# Patient Record
Sex: Female | Born: 1975 | Race: White | Hispanic: No | Marital: Married | Smoking: Current every day smoker
Health system: Southern US, Community
[De-identification: ages and names within clinical notes are randomized; demographics above are authoritative.]

## PROBLEM LIST (undated history)

## (undated) DIAGNOSIS — F32A Depression, unspecified: Secondary | ICD-10-CM

## (undated) DIAGNOSIS — R51 Headache: Secondary | ICD-10-CM

## (undated) DIAGNOSIS — T7840XA Allergy, unspecified, initial encounter: Secondary | ICD-10-CM

## (undated) DIAGNOSIS — E785 Hyperlipidemia, unspecified: Secondary | ICD-10-CM

## (undated) DIAGNOSIS — R519 Headache, unspecified: Secondary | ICD-10-CM

## (undated) DIAGNOSIS — F419 Anxiety disorder, unspecified: Secondary | ICD-10-CM

## (undated) DIAGNOSIS — R7301 Impaired fasting glucose: Secondary | ICD-10-CM

## (undated) DIAGNOSIS — F329 Major depressive disorder, single episode, unspecified: Secondary | ICD-10-CM

## (undated) DIAGNOSIS — F5104 Psychophysiologic insomnia: Secondary | ICD-10-CM

## (undated) DIAGNOSIS — K219 Gastro-esophageal reflux disease without esophagitis: Secondary | ICD-10-CM

## (undated) DIAGNOSIS — G5601 Carpal tunnel syndrome, right upper limb: Secondary | ICD-10-CM

## (undated) HISTORY — DX: Hyperlipidemia, unspecified: E78.5

## (undated) HISTORY — PX: EYE SURGERY: SHX253

## (undated) HISTORY — DX: Psychophysiologic insomnia: F51.04

## (undated) HISTORY — DX: Gastro-esophageal reflux disease without esophagitis: K21.9

## (undated) HISTORY — DX: Allergy, unspecified, initial encounter: T78.40XA

## (undated) HISTORY — DX: Impaired fasting glucose: R73.01

---

## 2001-06-16 ENCOUNTER — Encounter: Payer: Self-pay | Admitting: Family Medicine

## 2001-06-16 ENCOUNTER — Ambulatory Visit (HOSPITAL_COMMUNITY): Admission: RE | Admit: 2001-06-16 | Discharge: 2001-06-16 | Payer: Self-pay | Admitting: Advanced Practice Midwife

## 2001-11-10 ENCOUNTER — Other Ambulatory Visit: Admission: RE | Admit: 2001-11-10 | Discharge: 2001-11-10 | Payer: Self-pay | Admitting: Obstetrics and Gynecology

## 2002-02-22 ENCOUNTER — Other Ambulatory Visit: Admission: RE | Admit: 2002-02-22 | Discharge: 2002-02-22 | Payer: Self-pay | Admitting: Obstetrics and Gynecology

## 2002-04-12 ENCOUNTER — Ambulatory Visit (HOSPITAL_COMMUNITY): Admission: RE | Admit: 2002-04-12 | Discharge: 2002-04-12 | Payer: Self-pay | Admitting: Obstetrics and Gynecology

## 2002-04-12 ENCOUNTER — Encounter (INDEPENDENT_AMBULATORY_CARE_PROVIDER_SITE_OTHER): Payer: Self-pay | Admitting: Specialist

## 2002-04-27 ENCOUNTER — Emergency Department (HOSPITAL_COMMUNITY): Admission: EM | Admit: 2002-04-27 | Discharge: 2002-04-27 | Payer: Self-pay | Admitting: Internal Medicine

## 2002-04-27 ENCOUNTER — Encounter: Payer: Self-pay | Admitting: Internal Medicine

## 2005-05-24 ENCOUNTER — Encounter: Payer: Self-pay | Admitting: Orthopedic Surgery

## 2005-05-26 ENCOUNTER — Ambulatory Visit: Payer: Self-pay | Admitting: Orthopedic Surgery

## 2006-02-11 ENCOUNTER — Ambulatory Visit (HOSPITAL_COMMUNITY): Admission: RE | Admit: 2006-02-11 | Discharge: 2006-02-11 | Payer: Self-pay | Admitting: Family Medicine

## 2006-02-21 ENCOUNTER — Encounter (HOSPITAL_COMMUNITY): Admission: RE | Admit: 2006-02-21 | Discharge: 2006-03-23 | Payer: Self-pay | Admitting: Family Medicine

## 2006-03-23 ENCOUNTER — Ambulatory Visit: Payer: Self-pay | Admitting: Internal Medicine

## 2006-04-04 ENCOUNTER — Ambulatory Visit (HOSPITAL_COMMUNITY): Admission: RE | Admit: 2006-04-04 | Discharge: 2006-04-04 | Payer: Self-pay | Admitting: Internal Medicine

## 2006-04-04 ENCOUNTER — Encounter (INDEPENDENT_AMBULATORY_CARE_PROVIDER_SITE_OTHER): Payer: Self-pay | Admitting: Specialist

## 2006-04-04 ENCOUNTER — Ambulatory Visit: Payer: Self-pay | Admitting: Internal Medicine

## 2006-06-14 ENCOUNTER — Ambulatory Visit: Payer: Self-pay | Admitting: Internal Medicine

## 2008-12-06 ENCOUNTER — Encounter: Payer: Self-pay | Admitting: Orthopedic Surgery

## 2008-12-12 ENCOUNTER — Ambulatory Visit: Payer: Self-pay | Admitting: Orthopedic Surgery

## 2008-12-12 DIAGNOSIS — M25519 Pain in unspecified shoulder: Secondary | ICD-10-CM

## 2010-11-16 ENCOUNTER — Other Ambulatory Visit (HOSPITAL_COMMUNITY): Payer: Self-pay | Admitting: Family Medicine

## 2010-11-16 DIAGNOSIS — R51 Headache: Secondary | ICD-10-CM

## 2010-11-18 ENCOUNTER — Inpatient Hospital Stay (HOSPITAL_COMMUNITY): Admission: RE | Admit: 2010-11-18 | Payer: Self-pay | Source: Ambulatory Visit

## 2010-11-18 ENCOUNTER — Ambulatory Visit (HOSPITAL_COMMUNITY)
Admission: RE | Admit: 2010-11-18 | Discharge: 2010-11-18 | Disposition: A | Discharge status: Home / Self Care | Payer: Self-pay | Source: Ambulatory Visit | Attending: Family Medicine | Admitting: Family Medicine

## 2010-11-18 ENCOUNTER — Other Ambulatory Visit (HOSPITAL_COMMUNITY): Payer: Self-pay | Admitting: Family Medicine

## 2010-11-18 DIAGNOSIS — R51 Headache: Secondary | ICD-10-CM | POA: Insufficient documentation

## 2010-11-18 MED ORDER — GADOBENATE DIMEGLUMINE 529 MG/ML IV SOLN: 12.0000 mL | Freq: Once | INTRAVENOUS | Status: AC | PRN

## 2011-02-26 NOTE — Consult Note (Signed)
NAME:  Charlene Palmer, Charlene Palmer               ACCOUNT NO.:  [id number]   MEDICAL RECORD NO.:  [id number]          PATIENT TYPE:  OUT   LOCATION:  RAD                           FACILITY:  [facility]   PHYSICIAN:  R. Roetta Sessions, M.D. DATE OF BIRTH:  1975/10/22   DATE OF CONSULTATION:  03/23/2006  DATE OF DISCHARGE:  02/11/2006                                   CONSULTATION   REQUESTING PHYSICIAN:  Patrica Duel, M.D.   REASON FOR CONSULTATION:  Chronic diarrhea, hematochezia, abdominal pain.   HISTORY OF PRESENT ILLNESS:  Charlene Palmer is a 35 year old Caucasian female patient  of Dr. Patrica Duel who presents for further evaluation of a seven month  history of diarrhea with intermittent hematochezia.  Prior to these  symptoms, she would basically have one bowel movement daily.  No prior  history of constipation or diarrhea.  Now she is having 2-3 loose to watery  stools daily.  She has not had any solid stools in seven months.  She does  have diffuse intermittent abdominal pain, sometimes crampy in quality, maybe  in the upper abdomen or in the lower abdomen and changes.  She has had  several episodes of fresh blood per rectum, especially when on the toilet  tissue.  The last time was about three weeks ago.  She complains of fatigue,  occasional nausea.  No vomiting.  No heartburn symptoms, dysuria, hematuria,  sore throat, or chronic hoarseness.  She says Dr. Nobie Putnam recently examined  her mouth and told her she had some element of reflux.  She has had a sore  throat for about one week.  She was started on Bentyl three times a day, but  this constipated her.  She is taking it about once every three days.  Does  not help with her abdominal pain either.  She has not had any workup.  No  endoscopic evaluation.  She completed stool studies and Hemoccults, but she  is not aware of the results.   CURRENT MEDICATIONS:  1.  Butal/APAP 325 mg q.6h. p.r.n.  2.  Bentyl 10 mg q.i.d. p.r.n.  3.  Allegra 180 mg  daily p.r.n.   ALLERGIES:  TETRACYCLINE causes rash and vomiting.   PAST MEDICAL HISTORY:  1.  TMJ.  2.  History of headaches.  Self-treated with Advil in the month of Delphia,      when she reports consuming an entire bottle in a couple of weeks.  3.  Cesarean section.  She has had conization of her cervix for dysplasia      and a D&C.   FAMILY HISTORY:  Negative for chronic GI illnesses or colorectal cancer.   SOCIAL HISTORY:  She is married and has a 47-year-old daughter.  She is  employed as a Sales executive for [employer].  She smokes a pack of  cigarettes daily and smoked for over 12 years.  No alcohol use.   REVIEW OF SYSTEMS:  See HPI for GI.  CONSTITUTIONAL:  No weight loss.  CARDIOPULMONARY:  No chest pain or shortness of breath.  GENITOURINARY:  Currently completing her  menstrual cycle, which has been regular.  See HPI  for other GU symptoms.   PHYSICAL EXAMINATION:  VITAL SIGNS:  Weight 145.5.  Height 5 foot 6.  Temp  99.3, blood pressure 118/76.  GENERAL:  A pleasant, well-developed and well-nourished Caucasian female in  no acute distress.  SKIN:  Warm and dry.  No jaundice.  HEENT:  Pupils are equal, round and reactive to light.  Conjunctivae are  pink.  Sclerae are anicteric.  Oropharyngeal mucosa moist and pink.  No  lesions, erythema, or exudate.  NECK:  No lymphadenopathy or thyromegaly.  LUNGS:  Clear to auscultation.  HEART:  Regular rate and rhythm.  Normal S1 and S2.  No murmurs, rubs or  gallops.  ABDOMEN:  Positive bowel sounds.  Soft, nondistended.  She has mild  epigastric tenderness to deep palpation.  No rebound tenderness or guarding.  No abdominal bruits or hernias.  EXTREMITIES:  No edema.   IMPRESSION:  Charlene Palmer is a 35 year old lady with chronic diarrhea of seven  months' duration with intermittent toilet tissue hematochezia.  Symptoms are  associated with migratory abdominal pain, sometimes epigastric.  She has  consumed a significant  amount of Advil in the month for headache.  She  complains of intermittent nausea but no vomiting.  The etiology of chronic  diarrhea is unclear at this time.  Differential includes irritable bowel  syndrome with bleeding from benign rectal source such as hemorrhoids,  inflammatory bowel disease.  Cannot exclude celiac disease.  The patient  requests upper endoscopy, although at this time other than vague epigastric  pain, is fairly asymptomatic.   PLAN:  1.  Will plan on colonoscopy in the near future with Dr. Jena Gauss; however, if      this is negative, may consider upper endoscopy just in time to further      evaluate hematochezia and epigastric pain.  Could consider a small bowel      biopsy to rule out celiac disease if no other etiology of diarrhea has      been determined.  Not mentioned above, on an abdominal ultrasound      recently, she had a gallbladder polyp in Tuesday fundus of the      gallbladder versus nonmobile, nonshadowing gallstone.  She will need to      have a follow-up abdominal ultrasound in one year.  In addition, HIDA      scan was unremarkable.  2.  Retrieve stool studies and Hemoccults done through Dr. Loraine Leriche Cresenzo's      office.      Tana Coast, P.AJonathon Bellows, M.D.  Electronically Signed    LL/MEDQ  D:  03/23/2006  T:  03/23/2006  Job:  [id number]   cc:   Patrica Duel, M.D.  Fax: [phone]

## 2011-02-26 NOTE — Op Note (Signed)
NAME:  Charlene Palmer, Charlene Palmer               ACCOUNT NO.:  [id number]   MEDICAL RECORD NO.:  [id number]          PATIENT TYPE:  AMB   LOCATION:  DAY                           FACILITY:  [facility]   PHYSICIAN:  R. Roetta Sessions, M.D. DATE OF BIRTH:  30-Mar-1976   DATE OF PROCEDURE:  04/04/2006  DATE OF DISCHARGE:                                 OPERATIVE REPORT   PROCEDURE:  Colonoscopy with ileoscopy followed by EGD with small bowel  biopsy.   INDICATIONS FOR PROCEDURE:  The patient is a 35 year old female with a  several-month history of intermittent toilet tissue hematochezia.  She has  had some migratory abdominal pain and diarrhea in the setting of Advil use.  EGD and colonoscopy are now being done.  The procedure  was discussed with  the patient at length.  The potential risks, benefits, and alternatives have  been reviewed, questions answered.  Please see documentation in medical  records procedure note.  O2 saturation, blood pressure, pulse, and  respirations were monitored throughout the entire procedure.  Conscious  sedation with Versed 7 mg intravenously and Demerol 150 mg intravenously in  divided doses.   INSTRUMENTATION:  Olympus videoscope.   ASSESSMENT AND FINDINGS:  Digital rectal exam revealed no abnormalities.  Prep was adequate.  Rectum:  Examination of the rectal mucosa with the  retroflexed view of the anal verge demonstrated only minimal internal  hemorrhoids.  Colon:  The colonic mucosa was surveyed from the rectosigmoid  junction to the left transverse and right colon to the appendiceal orifice,  ileocecal valve, and cecum.  The structures were well-seen and photographed  for the record.  The terminal ileum was intubated a good 20 cm from the  __________.  Mucosal surfaces were again seen.  The colonic mucosa appeared  normal as did the terminal ileum mucosa.  Residue was suctioned out.  The  patient tolerated the procedure well and was then prepared for EGD.  Examination  of the esophagus revealed no mucosal abnormalities.  The EJ  junction was easily traversed into the stomach.  The gastric cavity was  emptied.  Thorough examination of the gastric mucosa, retroflexion in the  proximal stomach, esophagogastric junction demonstrated only a small hiatal  hernia, pylorus was patent and easily traversed.  Examination of the bulb to  the second and third portions revealed no abnormalities.   THERAPIES/DIAGNOSTIC MANEUVERS PERFORMED:  Biopsies of the second and third  portions of the duodenum were taken to rule out __________.  The patient  tolerated the procedure well.   IMPRESSION:  1.  Minimal anal canal internal hemorrhoids.  Otherwise normal rectum,      normal colon, normal terminal ileum, stool sample obtained.  2.  Esophagogastroduodenoscopy findings:  Normal esophagus, small hiatal      hernia, otherwise normal stomach, normal D1/D2/D3, status post biopsies      D2/D3.   RECOMMENDATIONS:  1.  Would avoid all forms of nonsteroidals at this time from here on out.  2.  Follow up on stool studies, follow up on biopsies, hemorrhoid literature      provided  to [name].  A 10-day course of Anusol-AC suppositories,      one per rectum at bedtime.  Again further recommendations to follow.      Jonathon Bellows, M.D.  Electronically Signed     RMR/MEDQ  D:  04/04/2006  T:  04/04/2006  Job:  [id number]   cc:   [facility]   R. Roetta Sessions, M.D.  [address]  [place]  [place] [address]

## 2011-02-26 NOTE — Op Note (Signed)
[facility]  Patient:    Charlene Palmer, Charlene Palmer Visit Number: [id number] MRN: [id number]          Service Type: DSU Location: DAY Attending Physician:  Malon Kindle Dictated by:   Malachi Pro. Ambrose Mantle, M.D. Proc. 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  OPERATION PERFORMED:   D&C laser cone.  SURGEON:  Malachi Pro. Ambrose Mantle, M.D.  ANESTHESIA:  General.  DESCRIPTION OF PROCEDURE:  The patient was brought to the operating room and placed under satisfactory general anesthesia and placed in lithotomy position. The vulva was prepped with Betadine solution. I did not prep the vagina to try to see if the acetic acid would give its best contrast of white epithelium to the normal pink epithelium. The cervix was exposed after draping the vulva with wet towels and acetic acid 5% was painted onto the cervix and then a colposcopy was done showing a large area of white epithelium with vascular changes involving both the anterior and posterior lips and the right lateral lip of the cervix. There were not any significant changes on the left. The squamocolumnar junction was far out on the patients cervix so that as I had explained to her in the office, we would do more of a cap like conization than a deep cone. If we did a true deep cone, we would take out most of her cervix. So I outlined the laser cone with a continuous laser with just spots with 10 watts and then injected the area with a dilute solution of Pitressin using about 10-12 cc of a mixture of 10 units of Pitressin and 120 cc of saline, so a total amount of 1 unit of Pitressin was used. I then switched to the super pulse laser at maximum  wattage and did the laser cone outline. Then using the double pronged hook I was able to elevate the cervical tissue so that I could angle in sharply so that we would not go too deeply into the cervical tissue. After I removed the cone with the scissors at the endocervical margin, I measured the cone and it seemed to be about 15 mm from the external cervix to the internal part of the endocervix. I then took a little bit more of the endocervical tissue with the scissors at about 3 oclock. At this point, I obtained complete hemostasis with a deep focus beam with 10 watts and then I did an endocervical followed by an endometrial curettage without any dilatation of the cervix, the uterus sounded about 3 inches posteriorly. I preserved the endocervical and endometrial tissue separately so that I had three specimens, the cone, the endocervical curettage and the endometrial curettage. I then reconstructed the architecture of the cervix by using four interrupted sutures of #0 Vicryl entering at 2 oclock exiting at 10, entering at 4 oclock exiting at 8 and then of course entering at 2 oclock exiting at 4 and at 8 oclock exiting at 10. I then placed a little bit of Munsell solution in the cervical canal and the procedure was terminated. Blood loss was thought to be less than 30 cc. The patient was  returned to recovery in satisfactory condition. Dictated by:   Malachi Pro. Ambrose Mantle, M.D. Attending Physician:  Malon Kindle DD:  04/12/02 TD:  04/16/02 Job: [id number]

## 2013-09-05 ENCOUNTER — Ambulatory Visit (INDEPENDENT_AMBULATORY_CARE_PROVIDER_SITE_OTHER): Payer: BC Managed Care – PPO | Admitting: Podiatry

## 2013-09-05 ENCOUNTER — Ambulatory Visit (INDEPENDENT_AMBULATORY_CARE_PROVIDER_SITE_OTHER): Payer: BC Managed Care – PPO

## 2013-09-05 ENCOUNTER — Encounter: Payer: Self-pay | Admitting: Podiatry

## 2013-09-05 VITALS — BP 148/79 | HR 90 | Resp 16 | Ht 66.0 in | Wt 170.0 lb

## 2013-09-05 DIAGNOSIS — M722 Plantar fascial fibromatosis: Secondary | ICD-10-CM

## 2013-09-05 MED ORDER — TRIAMCINOLONE ACETONIDE 10 MG/ML IJ SUSP
10.0000 mg | Freq: Once | INTRAMUSCULAR | Status: AC
  Administered 2013-09-05: 10 mg

## 2013-09-05 MED ORDER — MELOXICAM 15 MG PO TABS: 15.0000 mg | ORAL_TABLET | Freq: Every day | ORAL | Status: DC

## 2013-09-05 NOTE — Patient Instructions (Signed)
Plantar Fasciitis (Heel Spur Syndrome)  with Rehab  The plantar fascia is a fibrous, ligament-like, soft-tissue structure that spans the bottom of the foot. Plantar fasciitis is a condition that causes pain in the foot due to inflammation of the tissue.  SYMPTOMS   · Pain and tenderness on the underneath side of the foot.  · Pain that worsens with standing or walking.  CAUSES   Plantar fasciitis is caused by irritation and injury to the plantar fascia on the underneath side of the foot. Common mechanisms of injury include:  · Direct trauma to bottom of the foot.  · Damage to a small nerve that runs under the foot where the main fascia attaches to the heel bone.  · Stress placed on the plantar fascia due to bone spurs.  RISK INCREASES WITH:   · Activities that place stress on the plantar fascia (running, jumping, pivoting, or cutting).  · Poor strength and flexibility.  · Improperly fitted shoes.  · Tight calf muscles.  · Flat feet.  · Failure to warm-up properly before activity.  · Obesity.  PREVENTION  · Warm up and stretch properly before activity.  · Allow for adequate recovery between workouts.  · Maintain physical fitness:  · Strength, flexibility, and endurance.  · Cardiovascular fitness.  · Maintain a health body weight.  · Avoid stress on the plantar fascia.  · Wear properly fitted shoes, including arch supports for individuals who have flat feet.  PROGNOSIS   If treated properly, then the symptoms of plantar fasciitis usually resolve without surgery. However, occasionally surgery is necessary.  RELATED COMPLICATIONS   · Recurrent symptoms that may result in a chronic condition.  · Problems of the lower back that are caused by compensating for the injury, such as limping.  · Pain or weakness of the foot during push-off following surgery.  · Chronic inflammation, scarring, and partial or complete fascia tear, occurring more often from repeated injections.  TREATMENT   Treatment initially involves the use of  ice and medication to help reduce pain and inflammation. The use of strengthening and stretching exercises may help reduce pain with activity, especially stretches of the Achilles tendon. These exercises may be performed at home or with a therapist. Your caregiver may recommend that you use heel cups of arch supports to help reduce stress on the plantar fascia. Occasionally, corticosteroid injections are given to reduce inflammation. If symptoms persist for greater than 6 months despite non-surgical (conservative), then surgery may be recommended.   MEDICATION   · If pain medication is necessary, then nonsteroidal anti-inflammatory medications, such as aspirin and ibuprofen, or other minor pain relievers, such as acetaminophen, are often recommended.  · Do not take pain medication within 7 days before surgery.  · Prescription pain relievers may be given if deemed necessary by your caregiver. Use only as directed and only as much as you need.  · Corticosteroid injections may be given by your caregiver. These injections should be reserved for the most serious cases, because they may only be given a certain number of times.  HEAT AND COLD  · Cold treatment (icing) relieves pain and reduces inflammation. Cold treatment should be applied for 10 to 15 minutes every 2 to 3 hours for inflammation and pain and immediately after any activity that aggravates your symptoms. Use ice packs or massage the area with a piece of ice (ice massage).  · Heat treatment may be used prior to performing the stretching and strengthening activities prescribed   by your caregiver, physical therapist, or athletic trainer. Use a heat pack or soak the injury in warm water.  SEEK IMMEDIATE MEDICAL CARE IF:  · Treatment seems to offer no benefit, or the condition worsens.  · Any medications produce adverse side effects.  EXERCISES  RANGE OF MOTION (ROM) AND STRETCHING EXERCISES - Plantar Fasciitis (Heel Spur Syndrome)  These exercises may help you  when beginning to rehabilitate your injury. Your symptoms may resolve with or without further involvement from your physician, physical therapist or athletic trainer. While completing these exercises, remember:   · Restoring tissue flexibility helps normal motion to return to the joints. This allows healthier, less painful movement and activity.  · An effective stretch should be held for at least 30 seconds.  · A stretch should never be painful. You should only feel a gentle lengthening or release in the stretched tissue.  RANGE OF MOTION - Toe Extension, Flexion  · Sit with your right / left leg crossed over your opposite knee.  · Grasp your toes and gently pull them back toward the top of your foot. You should feel a stretch on the bottom of your toes and/or foot.  · Hold this stretch for __________ seconds.  · Now, gently pull your toes toward the bottom of your foot. You should feel a stretch on the top of your toes and or foot.  · Hold this stretch for __________ seconds.  Repeat __________ times. Complete this stretch __________ times per day.   RANGE OF MOTION - Ankle Dorsiflexion, Active Assisted  · Remove shoes and sit on a chair that is preferably not on a carpeted surface.  · Place right / left foot under knee. Extend your opposite leg for support.  · Keeping your heel down, slide your right / left foot back toward the chair until you feel a stretch at your ankle or calf. If you do not feel a stretch, slide your bottom forward to the edge of the chair, while still keeping your heel down.  · Hold this stretch for __________ seconds.  Repeat __________ times. Complete this stretch __________ times per day.   STRETCH  Gastroc, Standing  · Place hands on wall.  · Extend right / left leg, keeping the front knee somewhat bent.  · Slightly point your toes inward on your back foot.  · Keeping your right / left heel on the floor and your knee straight, shift your weight toward the wall, not allowing your back to  arch.  · You should feel a gentle stretch in the right / left calf. Hold this position for __________ seconds.  Repeat __________ times. Complete this stretch __________ times per day.  STRETCH  Soleus, Standing  · Place hands on wall.  · Extend right / left leg, keeping the other knee somewhat bent.  · Slightly point your toes inward on your back foot.  · Keep your right / left heel on the floor, bend your back knee, and slightly shift your weight over the back leg so that you feel a gentle stretch deep in your back calf.  · Hold this position for __________ seconds.  Repeat __________ times. Complete this stretch __________ times per day.  STRETCH  Gastrocsoleus, Standing   Note: This exercise can place a lot of stress on your foot and ankle. Please complete this exercise only if specifically instructed by your caregiver.   · Place the ball of your right / left foot on a step, keeping   your other foot firmly on the same step.  · Hold on to the wall or a rail for balance.  · Slowly lift your other foot, allowing your body weight to press your heel down over the edge of the step.  · You should feel a stretch in your right / left calf.  · Hold this position for __________ seconds.  · Repeat this exercise with a slight bend in your right / left knee.  Repeat __________ times. Complete this stretch __________ times per day.   STRENGTHENING EXERCISES - Plantar Fasciitis (Heel Spur Syndrome)   These exercises may help you when beginning to rehabilitate your injury. They may resolve your symptoms with or without further involvement from your physician, physical therapist or athletic trainer. While completing these exercises, remember:   · Muscles can gain both the endurance and the strength needed for everyday activities through controlled exercises.  · Complete these exercises as instructed by your physician, physical therapist or athletic trainer. Progress the resistance and repetitions only as guided.  STRENGTH - Towel  Curls  · Sit in a chair positioned on a non-carpeted surface.  · Place your foot on a towel, keeping your heel on the floor.  · Pull the towel toward your heel by only curling your toes. Keep your heel on the floor.  · If instructed by your physician, physical therapist or athletic trainer, add ____________________ at the end of the towel.  Repeat __________ times. Complete this exercise __________ times per day.  STRENGTH - Ankle Inversion  · Secure one end of a rubber exercise band/tubing to a fixed object (table, pole). Loop the other end around your foot just before your toes.  · Place your fists between your knees. This will focus your strengthening at your ankle.  · Slowly, pull your big toe up and in, making sure the band/tubing is positioned to resist the entire motion.  · Hold this position for __________ seconds.  · Have your muscles resist the band/tubing as it slowly pulls your foot back to the starting position.  Repeat __________ times. Complete this exercises __________ times per day.   Document Released: 09/27/2005 Document Revised: 12/20/2011 Document Reviewed: 01/09/2009  [employer]® Patient Information ©2014 [employer].

## 2013-09-05 NOTE — Progress Notes (Signed)
   Subjective:    Patient ID: Charlene Palmer, female    DOB: 27-Aug-1976, 37 y.o.   MRN: [id number]  HPI Comments: "My heel hurts"  N - sharp L - plantar heel right D - 2 mos O - gradual C - AM pain, worse A - walking T - changed shoes, advil, ice, Dr. Margart Sickles gel inserts      Review of Systems  HENT: Positive for sinus pressure.   Cardiovascular: Positive for leg swelling.  Musculoskeletal: Positive for arthralgias.  Neurological: Positive for numbness and headaches.  All other systems reviewed and are negative.       Objective:   Physical Exam        Assessment & Plan:

## 2013-09-07 NOTE — Progress Notes (Signed)
Subjective:     Patient ID: Charlene Palmer, female   DOB: 1976/03/24, 37 y.o.   MRN: [id number]  HPI patient presents stating I have been getting intense pain in my right heel for the last several months. She has tried shoe modifications ice therapy and over-the-counter insoles pain is worse when she gets up in the morning or after periods of sitting   Review of Systems  All other systems reviewed and are negative.       Objective:   Physical Exam  Constitutional: She is oriented to person, place, and time.  Cardiovascular: Intact distal pulses.   Musculoskeletal: Normal range of motion.  Neurological: She is oriented to person, place, and time.  Skin: Skin is warm.   patient is found to have pain evident tense nature right plantar heel insertional point of the tendon into the calcaneus. Neurovascular status intact with muscle strength adequate     Assessment:     Plantar fasciitis of an acute nature right with some structural abnormalities indicating chronic type condition    Plan:     H&P and x-rays reviewed. Injected the right plantar fascia 3 mg Kenalog 5 mg Xylocaine Marcaine mixture and applied fascially brace with instructions. Placed on oral anti-inflammatory and reappoint in 1 week and discuss possibilities for orthotics

## 2013-09-13 ENCOUNTER — Encounter: Payer: Self-pay | Admitting: Podiatry

## 2013-09-13 ENCOUNTER — Ambulatory Visit (INDEPENDENT_AMBULATORY_CARE_PROVIDER_SITE_OTHER): Payer: BC Managed Care – PPO | Admitting: Podiatry

## 2013-09-13 VITALS — BP 138/78 | HR 75 | Resp 16

## 2013-09-13 DIAGNOSIS — M722 Plantar fascial fibromatosis: Secondary | ICD-10-CM

## 2013-09-13 MED ORDER — TRIAMCINOLONE ACETONIDE 10 MG/ML IJ SUSP
10.0000 mg | Freq: Once | INTRAMUSCULAR | Status: AC
  Administered 2013-09-13: 10 mg

## 2013-09-13 NOTE — Progress Notes (Signed)
Subjective:     Patient ID: Charlene Palmer, female   DOB: Jan 25, 1976, 38 y.o.   MRN: [id number]  HPI patient states that my heel is still real sore and I got about 24 hours of relief followed by reoccurrence of symptoms  Review of Systems     Objective:   Physical Exam Neurovascular status intact with no change in health history. Patient has intense discomfort medial fascially and right at the insertional point but less than it was last week    Assessment:     Plantar fasciitis right heel still present with discomfort upon palpation    Plan:     Reviewed condition and discussed physical therapy and reinjected the plantar fascia 3 mg Kenalog 5 mg Xylocaine Marcaine mixture and dispensed night splint with instructions on usage. Continue anti-inflammatory and scanned for custom orthotic devices to reduce stress on the heels

## 2013-11-02 ENCOUNTER — Other Ambulatory Visit: Payer: BC Managed Care – PPO

## 2014-03-21 ENCOUNTER — Encounter: Payer: Self-pay | Admitting: Podiatry

## 2014-03-21 ENCOUNTER — Ambulatory Visit (INDEPENDENT_AMBULATORY_CARE_PROVIDER_SITE_OTHER): Payer: BC Managed Care – PPO | Admitting: Podiatry

## 2014-03-21 VITALS — BP 124/69 | HR 96 | Resp 16

## 2014-03-21 DIAGNOSIS — M722 Plantar fascial fibromatosis: Secondary | ICD-10-CM

## 2014-03-21 MED ORDER — TRIAMCINOLONE ACETONIDE 10 MG/ML IJ SUSP
10.0000 mg | Freq: Once | INTRAMUSCULAR | Status: AC
  Administered 2014-03-21: 10 mg

## 2014-03-21 NOTE — Patient Instructions (Signed)

## 2014-03-25 NOTE — Progress Notes (Signed)
Subjective:     Patient ID: Charlene Palmer, female   DOB: [birth year], 38 y.o.   MRN: [id number]  HPI patient states that she is improving but she is having pain in the heel right of a mild/moderate nature. States the orthotics help a lot the pain is occurring   Review of Systems     Objective:   Physical Exam Neurovascular status found to be intact with no changes in health history and pain to palpation of the plantar heel right is noted    Assessment:     Mild recurrence of fascially pain right    Plan:     Injected the plantar fascia 3 mg Kenalog Xylocaine and advised him reduced activity and continued orthotic usage but building them up slowly. Reappoint her recheck for consideration of lowering of the orthotics if pain persists

## 2014-10-17 ENCOUNTER — Ambulatory Visit: Payer: BC Managed Care – PPO | Admitting: Podiatry

## 2014-10-24 ENCOUNTER — Ambulatory Visit (INDEPENDENT_AMBULATORY_CARE_PROVIDER_SITE_OTHER): Payer: BLUE CROSS/BLUE SHIELD | Admitting: Podiatry

## 2014-10-24 ENCOUNTER — Encounter: Payer: Self-pay | Admitting: Podiatry

## 2014-10-24 VITALS — BP 142/79 | HR 93 | Resp 16

## 2014-10-24 DIAGNOSIS — M722 Plantar fascial fibromatosis: Secondary | ICD-10-CM | POA: Diagnosis not present

## 2014-10-24 MED ORDER — TRIAMCINOLONE ACETONIDE 10 MG/ML IJ SUSP
10.0000 mg | Freq: Once | INTRAMUSCULAR | Status: AC
  Administered 2014-10-24: 10 mg

## 2014-10-24 NOTE — Progress Notes (Signed)
Subjective:     Patient ID: Charlene Palmer, female   DOB: 1975/11/07, 39 y.o.   MRN: [id number]  HPI patient states my right foot is really bother me again and it started about 4 weeks ago and I do not remember specific incident   Review of Systems     Objective:   Physical Exam Neurovascular status intact with no change in health history and quite a bit of discomfort in the plantar right heel at the insertion of the tendon into the calcaneus    Assessment:     Plantar fasciitis right with inflammation    Plan:     Injected the right plantar fascia 3 mg Kenalog 5 mg Xylocaine advised on physical therapy supportive shoe and be seen as needed

## 2015-02-03 ENCOUNTER — Other Ambulatory Visit: Payer: Self-pay | Admitting: Orthopedic Surgery

## 2015-04-22 ENCOUNTER — Encounter (HOSPITAL_BASED_OUTPATIENT_CLINIC_OR_DEPARTMENT_OTHER): Payer: Self-pay | Admitting: *Deleted

## 2015-04-24 ENCOUNTER — Encounter (HOSPITAL_BASED_OUTPATIENT_CLINIC_OR_DEPARTMENT_OTHER)
Admission: RE | Disposition: A | Discharge status: Home / Self Care | Payer: Self-pay | Source: Ambulatory Visit | Attending: Orthopedic Surgery

## 2015-04-24 ENCOUNTER — Encounter (HOSPITAL_BASED_OUTPATIENT_CLINIC_OR_DEPARTMENT_OTHER): Payer: Self-pay | Admitting: Orthopedic Surgery

## 2015-04-24 ENCOUNTER — Ambulatory Visit (HOSPITAL_BASED_OUTPATIENT_CLINIC_OR_DEPARTMENT_OTHER): Payer: BLUE CROSS/BLUE SHIELD | Admitting: Anesthesiology

## 2015-04-24 ENCOUNTER — Ambulatory Visit (HOSPITAL_BASED_OUTPATIENT_CLINIC_OR_DEPARTMENT_OTHER)
Admission: RE | Admit: 2015-04-24 | Discharge: 2015-04-24 | Disposition: A | Discharge status: Home / Self Care | Payer: BLUE CROSS/BLUE SHIELD | Source: Ambulatory Visit | Attending: Orthopedic Surgery | Admitting: Orthopedic Surgery

## 2015-04-24 DIAGNOSIS — F17210 Nicotine dependence, cigarettes, uncomplicated: Secondary | ICD-10-CM | POA: Insufficient documentation

## 2015-04-24 DIAGNOSIS — F329 Major depressive disorder, single episode, unspecified: Secondary | ICD-10-CM | POA: Insufficient documentation

## 2015-04-24 DIAGNOSIS — G5622 Lesion of ulnar nerve, left upper limb: Secondary | ICD-10-CM | POA: Insufficient documentation

## 2015-04-24 DIAGNOSIS — F419 Anxiety disorder, unspecified: Secondary | ICD-10-CM | POA: Diagnosis not present

## 2015-04-24 DIAGNOSIS — Z793 Long term (current) use of hormonal contraceptives: Secondary | ICD-10-CM | POA: Insufficient documentation

## 2015-04-24 DIAGNOSIS — K219 Gastro-esophageal reflux disease without esophagitis: Secondary | ICD-10-CM | POA: Diagnosis not present

## 2015-04-24 DIAGNOSIS — G5602 Carpal tunnel syndrome, left upper limb: Secondary | ICD-10-CM | POA: Diagnosis not present

## 2015-04-24 DIAGNOSIS — Z79899 Other long term (current) drug therapy: Secondary | ICD-10-CM | POA: Insufficient documentation

## 2015-04-24 DIAGNOSIS — G5601 Carpal tunnel syndrome, right upper limb: Secondary | ICD-10-CM | POA: Diagnosis not present

## 2015-04-24 HISTORY — DX: Depression, unspecified: F32.A

## 2015-04-24 HISTORY — PX: CARPAL TUNNEL RELEASE: SHX101

## 2015-04-24 HISTORY — DX: Headache, unspecified: R51.9

## 2015-04-24 HISTORY — DX: Major depressive disorder, single episode, unspecified: F32.9

## 2015-04-24 HISTORY — DX: Headache: R51

## 2015-04-24 HISTORY — DX: Carpal tunnel syndrome, right upper limb: G56.01

## 2015-04-24 HISTORY — DX: Anxiety disorder, unspecified: F41.9

## 2015-04-24 SURGERY — CARPAL TUNNEL RELEASE
Anesthesia: Regional | Site: Wrist | Laterality: Right

## 2015-04-24 MED ORDER — PROPOFOL 500 MG/50ML IV EMUL
INTRAVENOUS | Status: AC
  Filled 2015-04-24: qty 50

## 2015-04-24 MED ORDER — SCOPOLAMINE 1 MG/3DAYS TD PT72: 1.0000 | MEDICATED_PATCH | Freq: Once | TRANSDERMAL | Status: DC | PRN

## 2015-04-24 MED ORDER — ONDANSETRON HCL 4 MG/2ML IJ SOLN: 4.0000 mg | Freq: Once | INTRAMUSCULAR | Status: DC | PRN

## 2015-04-24 MED ORDER — MEPERIDINE HCL 25 MG/ML IJ SOLN: 6.2500 mg | INTRAMUSCULAR | Status: DC | PRN

## 2015-04-24 MED ORDER — PROPOFOL 10 MG/ML IV BOLUS
INTRAVENOUS | Status: DC | PRN
  Administered 2015-04-24: 50 mg via INTRAVENOUS
  Administered 2015-04-24: 150 mg via INTRAVENOUS

## 2015-04-24 MED ORDER — MIDAZOLAM HCL 2 MG/2ML IJ SOLN
INTRAMUSCULAR | Status: AC
  Filled 2015-04-24: qty 2

## 2015-04-24 MED ORDER — LACTATED RINGERS IV SOLN
INTRAVENOUS | Status: DC
  Administered 2015-04-24: 08:00:00 via INTRAVENOUS

## 2015-04-24 MED ORDER — BUPIVACAINE HCL (PF) 0.25 % IJ SOLN
INTRAMUSCULAR | Status: AC
  Filled 2015-04-24: qty 180

## 2015-04-24 MED ORDER — OXYCODONE HCL 5 MG PO TABS
5.0000 mg | ORAL_TABLET | Freq: Once | ORAL | Status: AC
  Administered 2015-04-24: 5 mg via ORAL

## 2015-04-24 MED ORDER — TRAMADOL HCL 50 MG PO TABS: 50.0000 mg | ORAL_TABLET | Freq: Four times a day (QID) | ORAL | Status: DC | PRN

## 2015-04-24 MED ORDER — VANCOMYCIN HCL IN DEXTROSE 1-5 GM/200ML-% IV SOLN
1000.0000 mg | INTRAVENOUS | Status: AC
  Administered 2015-04-24: 1000 mg via INTRAVENOUS

## 2015-04-24 MED ORDER — FENTANYL CITRATE (PF) 100 MCG/2ML IJ SOLN
50.0000 ug | INTRAMUSCULAR | Status: DC | PRN
  Administered 2015-04-24: 100 ug via INTRAVENOUS

## 2015-04-24 MED ORDER — CHLORHEXIDINE GLUCONATE 4 % EX LIQD: 60.0000 mL | Freq: Once | CUTANEOUS | Status: DC

## 2015-04-24 MED ORDER — OXYCODONE HCL 5 MG PO TABS
ORAL_TABLET | ORAL | Status: AC
  Filled 2015-04-24: qty 1

## 2015-04-24 MED ORDER — MIDAZOLAM HCL 2 MG/2ML IJ SOLN
1.0000 mg | INTRAMUSCULAR | Status: DC | PRN
  Administered 2015-04-24: 2 mg via INTRAVENOUS

## 2015-04-24 MED ORDER — ONDANSETRON HCL 4 MG/2ML IJ SOLN
INTRAMUSCULAR | Status: DC | PRN
  Administered 2015-04-24: 4 mg via INTRAVENOUS

## 2015-04-24 MED ORDER — GLYCOPYRROLATE 0.2 MG/ML IJ SOLN: 0.2000 mg | Freq: Once | INTRAMUSCULAR | Status: DC | PRN

## 2015-04-24 MED ORDER — BUPIVACAINE HCL (PF) 0.25 % IJ SOLN
INTRAMUSCULAR | Status: DC | PRN
  Administered 2015-04-24: 8 mL

## 2015-04-24 MED ORDER — FENTANYL CITRATE (PF) 100 MCG/2ML IJ SOLN
INTRAMUSCULAR | Status: AC
  Filled 2015-04-24: qty 4

## 2015-04-24 MED ORDER — HYDROMORPHONE HCL 1 MG/ML IJ SOLN: 0.2500 mg | INTRAMUSCULAR | Status: DC | PRN

## 2015-04-24 MED ORDER — LIDOCAINE HCL (CARDIAC) 20 MG/ML IV SOLN
INTRAVENOUS | Status: DC | PRN
  Administered 2015-04-24: 50 mg via INTRAVENOUS

## 2015-04-24 MED ORDER — DEXAMETHASONE SODIUM PHOSPHATE 10 MG/ML IJ SOLN
INTRAMUSCULAR | Status: DC | PRN
  Administered 2015-04-24: 10 mg via INTRAVENOUS

## 2015-04-24 MED ORDER — VANCOMYCIN HCL IN DEXTROSE 1-5 GM/200ML-% IV SOLN: 1000.0000 mg | INTRAVENOUS | Status: DC

## 2015-04-24 MED ORDER — VANCOMYCIN HCL IN DEXTROSE 1-5 GM/200ML-% IV SOLN
INTRAVENOUS | Status: AC
  Filled 2015-04-24: qty 200

## 2015-04-24 SURGICAL SUPPLY — 40 items
BLADE SURG 15 STRL LF DISP TIS (BLADE) ×1 IMPLANT
BLADE SURG 15 STRL SS (BLADE) ×3
BNDG CMPR 9X4 STRL LF SNTH (GAUZE/BANDAGES/DRESSINGS) ×1
BNDG COHESIVE 3X5 TAN STRL LF (GAUZE/BANDAGES/DRESSINGS) ×3 IMPLANT
BNDG ESMARK 4X9 LF (GAUZE/BANDAGES/DRESSINGS) ×3 IMPLANT
BNDG GAUZE ELAST 4 BULKY (GAUZE/BANDAGES/DRESSINGS) ×3 IMPLANT
CHLORAPREP W/TINT 26ML (MISCELLANEOUS) ×3 IMPLANT
CORDS BIPOLAR (ELECTRODE) ×3 IMPLANT
COVER BACK TABLE 60X90IN (DRAPES) ×3 IMPLANT
COVER MAYO STAND STRL (DRAPES) ×3 IMPLANT
CUFF TOURNIQUET SINGLE 18IN (TOURNIQUET CUFF) ×3 IMPLANT
DRAPE EXTREMITY T 121X128X90 (DRAPE) ×3 IMPLANT
DRAPE SURG 17X23 STRL (DRAPES) ×3 IMPLANT
DRSG PAD ABDOMINAL 8X10 ST (GAUZE/BANDAGES/DRESSINGS) ×3 IMPLANT
GAUZE SPONGE 4X4 12PLY STRL (GAUZE/BANDAGES/DRESSINGS) ×3 IMPLANT
GAUZE XEROFORM 1X8 LF (GAUZE/BANDAGES/DRESSINGS) ×3 IMPLANT
GLOVE BIO SURGEON STRL SZ 6.5 (GLOVE) ×2 IMPLANT
GLOVE BIO SURGEONS STRL SZ 6.5 (GLOVE) ×1
GLOVE BIOGEL PI IND STRL 6.5 (GLOVE) IMPLANT
GLOVE BIOGEL PI IND STRL 7.0 (GLOVE) IMPLANT
GLOVE BIOGEL PI IND STRL 8.5 (GLOVE) ×1 IMPLANT
GLOVE BIOGEL PI INDICATOR 6.5 (GLOVE) ×2
GLOVE BIOGEL PI INDICATOR 7.0 (GLOVE) ×2
GLOVE BIOGEL PI INDICATOR 8.5 (GLOVE) ×2
GLOVE SURG ORTHO 8.0 STRL STRW (GLOVE) ×1 IMPLANT
GLOVE SURG SS PI 8.0 STRL IVOR (GLOVE) ×3 IMPLANT
GOWN STRL REUS W/ TWL LRG LVL3 (GOWN DISPOSABLE) ×1 IMPLANT
GOWN STRL REUS W/TWL LRG LVL3 (GOWN DISPOSABLE) ×3
GOWN STRL REUS W/TWL XL LVL3 (GOWN DISPOSABLE) ×3 IMPLANT
NEEDLE PRECISIONGLIDE 27X1.5 (NEEDLE) ×3 IMPLANT
NS IRRIG 1000ML POUR BTL (IV SOLUTION) ×3 IMPLANT
PACK BASIN DAY SURGERY FS (CUSTOM PROCEDURE TRAY) ×3 IMPLANT
STOCKINETTE 4X48 STRL (DRAPES) ×3 IMPLANT
SUT ETHILON 4 0 PS 2 18 (SUTURE) ×4 IMPLANT
SUT VICRYL 4-0 PS2 18IN ABS (SUTURE) IMPLANT
SYR BULB 3OZ (MISCELLANEOUS) ×3 IMPLANT
SYR CONTROL 10ML LL (SYRINGE) ×2 IMPLANT
TOWEL OR 17X24 6PK STRL BLUE (TOWEL DISPOSABLE) ×3 IMPLANT
TOWEL OR NON WOVEN STRL DISP B (DISPOSABLE) ×3 IMPLANT
UNDERPAD 30X30 (UNDERPADS AND DIAPERS) ×1 IMPLANT

## 2015-04-24 NOTE — Discharge Instructions (Addendum)

## 2015-04-24 NOTE — Transfer of Care (Signed)
Immediate Anesthesia Transfer of Care Note  Patient: Charlene Palmer  Procedure(s) Performed: Procedure(s): RIGHT CARPAL TUNNEL RELEASE (Right)  Patient Location: PACU  Anesthesia Type:General  Level of Consciousness: awake, sedated and patient cooperative  Airway & Oxygen Therapy: Patient Spontanous Breathing and Patient connected to face mask oxygen  Post-op Assessment: Report given to RN and Post -op Vital signs reviewed and stable  Post vital signs: Reviewed and stable  Last Vitals:  Filed Vitals:   04/24/15 0803  BP: 123/65  Pulse: 84  Temp: 36.9 C  Resp: 20    Complications: No apparent anesthesia complications

## 2015-04-24 NOTE — Anesthesia Preprocedure Evaluation (Signed)
Anesthesia Evaluation  Patient identified by MRN, date of birth, ID band Patient awake    Reviewed: Allergy & Precautions, NPO status , Patient's Chart, lab work & pertinent test results  Airway Mallampati: I  TM Distance: >3 FB Neck ROM: Full    Dental   Pulmonary Current Smoker,    Pulmonary exam normal       Cardiovascular Normal cardiovascular exam    Neuro/Psych    GI/Hepatic GERD-  Controlled and Medicated,  Endo/Other    Renal/GU      Musculoskeletal   Abdominal   Peds  Hematology   Anesthesia Other Findings   Reproductive/Obstetrics                             Anesthesia Physical Anesthesia Plan  ASA: II  Anesthesia Plan: Bier Block   Post-op Pain Management:    Induction: Intravenous  Airway Management Planned: Simple Face Mask  Additional Equipment:   Intra-op Plan:   Post-operative Plan:   Informed Consent: I have reviewed the patients History and Physical, chart, labs and discussed the procedure including the risks, benefits and alternatives for the proposed anesthesia with the patient or authorized representative who has indicated his/her understanding and acceptance.     Plan Discussed with: CRNA and Surgeon  Anesthesia Plan Comments:         Anesthesia Quick Evaluation

## 2015-04-24 NOTE — Anesthesia Procedure Notes (Signed)
Procedure Name: LMA Insertion Date/Time: 04/24/2015 8:43 AM Performed by: Lyndee Leo Pre-anesthesia Checklist: Patient identified, Emergency Drugs available, Suction available and Patient being monitored Patient Re-evaluated:Patient Re-evaluated prior to inductionOxygen Delivery Method: Circle System Utilized Preoxygenation: Pre-oxygenation with 100% oxygen Intubation Type: IV induction Ventilation: Mask ventilation without difficulty LMA: LMA inserted LMA Size: 4.0 Number of attempts: 1 Airway Equipment and Method: Bite block Placement Confirmation: positive ETCO2 Tube secured with: Tape Dental Injury: Teeth and Oropharynx as per pre-operative assessment

## 2015-04-24 NOTE — Op Note (Signed)
Other Dictation: Dictation Number [id number]

## 2015-04-24 NOTE — Brief Op Note (Signed)
04/24/2015  9:10 AM  PATIENT:  Charlene Palmer  39 y.o. female  PRE-OPERATIVE DIAGNOSIS:  RIGHT CARPAL TUNNEL SYNDROME  POST-OPERATIVE DIAGNOSIS:  RIGHT CARPAL TUNNEL SYNDROME  PROCEDURE:  Procedure(s): RIGHT CARPAL TUNNEL RELEASE (Right)  SURGEON:  Surgeon(s) and Role:    * Daryll Brod, MD - Primary  PHYSICIAN ASSISTANT:   ASSISTANTS: none   ANESTHESIA:   local and general  EBL:  Total I/O In: 500 [I.V.:500] Out: -   BLOOD ADMINISTERED:none  DRAINS: none   LOCAL MEDICATIONS USED:  BUPIVICAINE   SPECIMEN:  No Specimen  DISPOSITION OF SPECIMEN:  N/A  COUNTS:  YES  TOURNIQUET:   Total Tourniquet Time Documented: Upper Arm (Right) - 12 minutes Total: Upper Arm (Right) - 12 minutes   DICTATION: .Other Dictation: Dictation Number [id number]  PLAN OF CARE: Discharge to home after PACU  PATIENT DISPOSITION:  PACU - hemodynamically stable.

## 2015-04-24 NOTE — Anesthesia Postprocedure Evaluation (Signed)
Anesthesia Post Note  Patient: Charlene Palmer  Procedure(s) Performed: Procedure(s) (LRB): RIGHT CARPAL TUNNEL RELEASE (Right)  Anesthesia type: general  Patient location: PACU  Post pain: Pain level controlled  Post assessment: Patient's Cardiovascular Status Stable  Last Vitals:  Filed Vitals:   04/24/15 0953  BP: 130/68  Pulse: 74  Temp: 36.8 C  Resp: 18    Post vital signs: Reviewed and stable  Level of consciousness: sedated  Complications: No apparent anesthesia complications

## 2015-04-24 NOTE — H&P (Signed)
Charlene Palmer is a 39 year-old right-hand dominant female referred by herself via Dr. Luna Glasgow.  She is complaining of bilateral carpal tunnel syndrome. She has had nerve conductions done in 2014. She has had symptoms for three years.  Nerve conductions done by Dr. Merlene Laughter were positive at that time for moderate right carpal tunnel syndrome, mild left.  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 She is complaining primarily of the right side.  She has not had any further treatment for this. She has no history of injury to the hand or to the neck. She is awakened 7 out of 7 nights with numbness and tingling, thumb through ring finger.  She has no history of diabetes, thyroid problems, arthritis or gout. She has worn a brace. She states it is not working as well at the present time.  She is not taking anything by mouth.  She complains of intermittent, throbbing, aching, burning type pain with a feeling of numbness.  Garnell's symptoms have recurred following injection. She states that she had excellent relief for a short period of time, but this has recurred.  She would like to have this surgically released.  Nerve conductions are reviewed with motor delay of 5+ on her right side, barely abnormal on her left.    ALLERGIES:    Tetracycline and Vicodin.  MEDICATIONS:     Celexa, Zyrtec, Provera and omeprazole.  SURGICAL HISTORY:    Conization of her cervix.  FAMILY MEDICAL HISTORY:    Positive for diabetes, heart disease and high blood pressure, arthritis.  SOCIAL HISTORY:     She smokes half pack a day and is advised to quit with the reasons behind this.  She does not drink. 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  REVIEW OF SYSTEMS:   Positive for weight loss, ringing in her ears, headaches, anemia, otherwise negative 14 points.  Charlene Palmer is an 39 y.o. female.   Chief Complaint: numbness right hand HPI: see above  Past Medical History  Diagnosis Date  . GERD (gastroesophageal reflux disease)   .  Allergy   . Depression   . Anxiety   . Headache     imitrex as needed  . Carpal tunnel syndrome of right wrist     Past Surgical History  Procedure Laterality Date  . Cesarean section      History reviewed. No pertinent family history. Social History:  reports that she has been smoking Cigarettes.  She has been smoking about 0.50 packs per day. She does not have any smokeless tobacco history on file. She reports that she does not drink alcohol. Her drug history is not on file.  Allergies:  Allergies  Allergen Reactions  . Augmentin [Amoxicillin-Pot Clavulanate]   . Crestor [Rosuvastatin] Hives  . Latex   . Tetracycline     Medications Prior to Admission  Medication Sig Dispense Refill  . cetirizine (ZYRTEC) 10 MG tablet Take 10 mg by mouth daily.    . citalopram (CELEXA) 40 MG tablet Take 40 mg by mouth daily.    . medroxyPROGESTERone (PROVERA) 5 MG tablet Take 5 mg by mouth daily.    . meloxicam (MOBIC) 15 MG tablet Take 1 tablet (15 mg total) by mouth daily. 30 tablet 2  . omeprazole (PRILOSEC) 20 MG capsule Take 20 mg by mouth daily.      No results found for this or any previous visit (from the past 48 hour(s)).  No results found.   Pertinent items are  noted in HPI.  Height 5' 5.5" (1.664 m), weight 77.111 kg (170 lb).  General appearance: alert, cooperative and appears stated age Head: Normocephalic, without obvious abnormality Neck: no JVD Resp: clear to auscultation bilaterally Cardio: regular rate and rhythm, S1, S2 normal, no murmur, click, rub or gallop GI: soft, non-tender; bowel sounds normal; no masses,  no organomegaly Extremities: extremities normal, atraumatic, no cyanosis or edema numbness right  Pulses: 2+ and symmetric Skin: Skin color, texture, turgor normal. No rashes or lesions Neurologic: Grossly normal Incision/Wound: na  Assessment/Plan  DIAGNOSIS: Carpal tunnel syndrome bilateral  The pre, peri and postoperative course were  discussed along with the risks and complications.  The patient is aware there is no guarantee with the surgery, possibility of infection, recurrence, injury to arteries, nerves, tendons, incomplete relief of symptoms and dystrophy.    She is scheduled for right carpal tunnel release as an outpatient under regional anesthesia.  Wilene Pharo R 04/24/2015, 7:42 AM

## 2015-04-25 ENCOUNTER — Encounter (HOSPITAL_BASED_OUTPATIENT_CLINIC_OR_DEPARTMENT_OTHER): Payer: Self-pay | Admitting: Orthopedic Surgery

## 2015-04-25 NOTE — Op Note (Signed)
Charlene Palmer, DIZDAREVIC NO.:  [id number]  MEDICAL RECORD NO.:  [id number]  LOCATION:                               FACILITY:  [facility]  PHYSICIAN:  Daryll Brod, M.D.       DATE OF BIRTH:  17-Sep-1976  DATE OF PROCEDURE:  04/24/2015 DATE OF DISCHARGE:  04/24/2015                              OPERATIVE REPORT   PREOPERATIVE DIAGNOSIS:  Carpal tunnel syndrome, right hand.  POSTOPERATIVE DIAGNOSIS:  Carpal tunnel syndrome, right hand.  OPERATION:  Decompression of right median nerve.  SURGEON:  Daryll Brod, M.D.  ANESTHESIA:  General with local infiltration.  ANESTHESIOLOGIST:  Lorrene Reid, M.D.  HISTORY:  The patient is a 39 year old female with history of carpal tunnel syndrome.  Nerve conduction is positive.  This is not responded to conservative treatment.  She has elected to undergo surgical decompression of the median nerve.  Pre, peri, and postoperative course have been discussed along with risks and complications.  She is aware that there is no guarantee with the surgery; possibility of infection; recurrence of injury to arteries, nerves, tendons; incomplete relief of symptoms and dystrophy.  In the preoperative area, the patient is seen, the extremity marked by both the patient and surgeon, and antibiotic given.  DESCRIPTION OF PROCEDURE:  The patient was brought to the operating room, where a general anesthetic was carried out without difficulty. Her right arm was prepped in supine position with ChloraPrep.  The limb was then exsanguinated with an Esmarch bandage.  Tourniquet placed high on the arm was inflated to 250 mmHg.  A longitudinal incision was made in the right palm, carried down through the subcutaneous tissue. Bleeders were electrocauterized.  Palmar fascia was split.  Superficial palmar arch identified.  The flexor tendon to the ring and little finger identified to the ulnar side of the median nerve.  The carpal retinaculum was incised with  sharp dissection.  Right angle and Sewall retractor were placed between the skin and forearm fascia.  The fascia was released for approximately 2 cm proximal to the wrist crease under direct vision.  Canal was explored.  Air compression to the nerve was apparent.  Motor branch entered into the muscle.  No further lesions were identified.  The wound was copiously irrigated with saline.  The skin was closed with 4-0 nylon sutures.  Local infiltration with 0.25% bupivacaine without epinephrine was given, approximately 8 mL was used. Sterile compressive dressing with fingers free was applied.  On deflation of the tourniquet, all fingers were immediately pinked.  She was taken to the recovery room for observation in satisfactory condition.  She will be discharged to home to return to the [facility] in 1 week, on Ultram.    ______________________________ Daryll Brod, M.D.   ______________________________ Daryll Brod, M.D.    GK/MEDQ  D:  04/24/2015  T:  04/24/2015  Job:  [id number]

## 2015-06-10 ENCOUNTER — Telehealth: Payer: Self-pay | Admitting: *Deleted

## 2015-06-10 NOTE — Telephone Encounter (Signed)
Pt asked about the warrant and cost for repair of the orthotics purchased 09/23/2104, one has a heel worn through and has become uncomfortable.

## 2015-08-07 ENCOUNTER — Encounter: Payer: Self-pay | Admitting: Podiatry

## 2015-08-07 DIAGNOSIS — M722 Plantar fascial fibromatosis: Secondary | ICD-10-CM

## 2015-09-08 ENCOUNTER — Ambulatory Visit (INDEPENDENT_AMBULATORY_CARE_PROVIDER_SITE_OTHER): Payer: BLUE CROSS/BLUE SHIELD | Admitting: Podiatry

## 2015-09-08 VITALS — BP 130/80 | HR 78 | Resp 12

## 2015-09-08 DIAGNOSIS — M722 Plantar fascial fibromatosis: Secondary | ICD-10-CM

## 2015-09-08 MED ORDER — TRIAMCINOLONE ACETONIDE 10 MG/ML IJ SUSP
10.0000 mg | Freq: Once | INTRAMUSCULAR | Status: AC
  Administered 2015-09-08: 10 mg

## 2015-09-09 NOTE — Progress Notes (Signed)
Subjective:     Patient ID: Charlene Palmer, female   DOB: Jun 11, 1976, 39 y.o.   MRN: [id number]  HPI patient presents stating both my heels are bothering me a lot and making it hard to be   Review of Systems     Objective:   Physical Exam Neurovascular status intact muscle strength adequate with exquisite discomfort plantar aspect heel region bilateral which is just started again in the last 6 weeks    Assessment:     Acute plantar fasciitis bilateral    Plan:     Reinjected the plantar fascia bilateral 3 mg Kenalog 5 mg Xylocaine and instructed on physical therapy

## 2015-09-11 ENCOUNTER — Ambulatory Visit: Payer: BLUE CROSS/BLUE SHIELD | Admitting: Podiatry

## 2015-10-07 ENCOUNTER — Other Ambulatory Visit (HOSPITAL_COMMUNITY): Payer: Self-pay | Admitting: Internal Medicine

## 2015-10-07 ENCOUNTER — Ambulatory Visit (HOSPITAL_COMMUNITY)
Admission: RE | Admit: 2015-10-07 | Discharge: 2015-10-07 | Disposition: A | Discharge status: Home / Self Care | Payer: BLUE CROSS/BLUE SHIELD | Source: Ambulatory Visit | Attending: Internal Medicine | Admitting: Internal Medicine

## 2015-10-07 DIAGNOSIS — R0989 Other specified symptoms and signs involving the circulatory and respiratory systems: Secondary | ICD-10-CM

## 2015-10-07 DIAGNOSIS — R05 Cough: Secondary | ICD-10-CM | POA: Diagnosis present

## 2015-10-07 DIAGNOSIS — R059 Cough, unspecified: Secondary | ICD-10-CM

## 2015-10-07 DIAGNOSIS — R079 Chest pain, unspecified: Secondary | ICD-10-CM

## 2016-08-04 DIAGNOSIS — R030 Elevated blood-pressure reading, without diagnosis of hypertension: Secondary | ICD-10-CM | POA: Insufficient documentation

## 2016-08-04 DIAGNOSIS — F5101 Primary insomnia: Secondary | ICD-10-CM

## 2016-08-04 DIAGNOSIS — E782 Mixed hyperlipidemia: Secondary | ICD-10-CM | POA: Insufficient documentation

## 2016-08-04 DIAGNOSIS — K219 Gastro-esophageal reflux disease without esophagitis: Secondary | ICD-10-CM | POA: Insufficient documentation

## 2016-08-04 NOTE — Progress Notes (Signed)
Cardiology Office Note  Date: 08/05/2016   ID: Charlene Palmer, DOB 31-May-1976, MRN [id number]  PCP: Wende Neighbors, MD  Consulting Cardiologist: Rozann Lesches, MD   Chief Complaint  Patient presents with  . Chest Pain    History of Present Illness: Charlene Palmer is a 40 y.o. female referred for cardiology consultation by Dr. Nevada Crane for the evaluation of chest pain. She states that she has a history of recurring chest pain with anxiety flareups, but over the last year has had some episodes that were not necessarily explained by anxiety. She feels a tight sensation in her arms that radiates up into her chest and neck, usually lasts for about a minute. These episodes have occurred sporadically at rest. She does not report any definite exertional chest pain or worsening shortness of breath with usual activities.  I reviewed her most recent ECG which is outlined below. She does have a family history of premature CAD in first-degree relatives. No other major personal cardiac risk factors other than tobacco use and also impaired glucose tolerance. She is trying to lose some weight. Has been limited in terms of exercise secondary to plantar fasciitis.   Past Medical History:  Diagnosis Date  . Allergy   . Anxiety   . Carpal tunnel syndrome of right wrist   . Depression   . GERD (gastroesophageal reflux disease)   . Headache    imitrex as needed  . Hyperlipidemia   . Impaired fasting glucose     Past Surgical History:  Procedure Laterality Date  . CARPAL TUNNEL RELEASE Right 04/24/2015   Procedure: RIGHT CARPAL TUNNEL RELEASE;  Surgeon: Daryll Brod, MD;  Location: [facility];  Service: Orthopedics;  Laterality: Right;  . CESAREAN SECTION      Current Outpatient Prescriptions  Medication Sig Dispense Refill  . aspirin 81 MG chewable tablet Chew by mouth daily.    . cetirizine (ZYRTEC) 10 MG tablet Take 10 mg by mouth daily.    . citalopram (CELEXA) 40 MG tablet Take 40 mg  by mouth daily.    . Eszopiclone (ESZOPICLONE) 3 MG TABS Take 3 mg by mouth at bedtime. Take immediately before bedtime    . Icosapent Ethyl (VASCEPA) 1 g CAPS Take 1 capsule by mouth 2 (two) times daily.    . medroxyPROGESTERone (PROVERA) 5 MG tablet Take 5 mg by mouth daily.   10  . omega-3 acid ethyl esters (LOVAZA) 1 g capsule Take 2 g by mouth 2 (two) times daily.   1  . pantoprazole (PROTONIX) 40 MG tablet Take 40 mg by mouth daily.     No current facility-administered medications for this visit.    Allergies:  Statins; Vicodin [hydrocodone-acetaminophen]; Augmentin [amoxicillin-pot clavulanate]; Crestor [rosuvastatin]; Latex; and Tetracycline   Social History: The patient  reports that she has been smoking Cigarettes.  She started smoking about 24 years ago. She has been smoking about 0.50 packs per day. She has never used smokeless tobacco. She reports that she drinks alcohol. She reports that she does not use drugs.   Family History: The patient's family history includes COPD in her sister; Diabetes in her maternal grandmother and sister; Heart attack in her father; Heart disease in her father, maternal grandmother, and mother; Heart failure in her sister; Hyperlipidemia in her father; Hypertension in her father, mother, and sister.   ROS:  Please see the history of present illness. Otherwise, complete review of systems is positive for none.  All other  systems are reviewed and negative.   Physical Exam: VS:  BP 110/62 (BP Location: Right Leg)   Pulse 74   Ht 5' 5"  (1.651 m)   Wt 171 lb (77.6 kg)   LMP 11/04/2010   SpO2 97%   BMI 28.46 kg/m , BMI Body mass index is 28.46 kg/m.  Wt Readings from Last 3 Encounters:  08/05/16 171 lb (77.6 kg)  05/14/16 180 lb (81.6 kg)  04/24/15 173 lb 6 oz (78.6 kg)    General: Overweight woman, appears comfortable at rest. HEENT: Conjunctiva and lids normal, oropharynx clear. Neck: Supple, no elevated JVP or carotid bruits, no  thyromegaly. Lungs: Clear to auscultation, nonlabored breathing at rest. Cardiac: Regular rate and rhythm, no S3 or significant systolic murmur, no pericardial rub. Abdomen: Soft, nontender, bowel sounds present, no guarding or rebound. Extremities: No pitting edema, distal pulses 2+. Skin: Warm and dry. Musculoskeletal: No kyphosis. Neuropsychiatric: Alert and oriented x3, affect grossly appropriate.  ECG: I personally reviewed the tracing from 06/02/2016 which showed normal sinus rhythm.  Other Studies Reviewed Today:  Chest x-ray 10/07/2015: FINDINGS: The heart size and mediastinal contours are within normal limits. Both lungs are clear. The visualized skeletal structures are unremarkable.  IMPRESSION: No active cardiopulmonary disease.  Assessment and Plan:  1. Atypical chest pain as outlined. She has a family history of premature CAD in first-degree relatives, also glucose intolerance and tobacco use history. Recent ECG reviewed and normal. We discussed basic ischemic and structural cardiac evaluation and will proceed with a GXT as well as echocardiogram.  2. Ongoing tobacco abuse. She has quit before, is trying to quit again. She was successful using nicotine replacement in the past but did not tolerate Chantix.  3. Impaired glucose tolerance. Patient is working on weight loss at this time.  Current medicines were reviewed with the patient today.   Orders Placed This Encounter  Procedures  . Exercise Tolerance Test  . ECHOCARDIOGRAM COMPLETE    Disposition: Call with test results.  Signed, Satira Sark, MD, [facility] 08/05/2016 2:09 PM    [facility] [address], [place], [place] [address] Phone: [phone]; Fax: [phone]

## 2016-08-05 ENCOUNTER — Ambulatory Visit (INDEPENDENT_AMBULATORY_CARE_PROVIDER_SITE_OTHER): Payer: BLUE CROSS/BLUE SHIELD | Admitting: Cardiology

## 2016-08-05 ENCOUNTER — Encounter: Payer: Self-pay | Admitting: Cardiology

## 2016-08-05 VITALS — BP 110/62 | HR 74 | Ht 65.0 in | Wt 171.0 lb

## 2016-08-05 DIAGNOSIS — R7302 Impaired glucose tolerance (oral): Secondary | ICD-10-CM | POA: Diagnosis not present

## 2016-08-05 DIAGNOSIS — R0789 Other chest pain: Secondary | ICD-10-CM | POA: Diagnosis not present

## 2016-08-05 DIAGNOSIS — Z720 Tobacco use: Secondary | ICD-10-CM | POA: Diagnosis not present

## 2016-08-05 NOTE — Patient Instructions (Signed)
Your physician recommends that you schedule a follow-up appointment in: to be determined after tests.    Your physician recommends that you continue on your current medications as directed. Please refer to the Current Medication list given to you today.    Your physician has requested that you have an echocardiogram. Echocardiography is a painless test that uses sound waves to create images of your heart. It provides your doctor with information about the size and shape of your heart and how well your heart's chambers and valves are working. This procedure takes approximately one hour. There are no restrictions for this procedure.     Your physician has requested that you have an exercise tolerance test. For further information please visit [email or web address]. Please also follow instruction sheet, as given.        Thank you for choosing [facility] !

## 2016-08-20 ENCOUNTER — Ambulatory Visit (HOSPITAL_COMMUNITY)
Admission: RE | Admit: 2016-08-20 | Discharge: 2016-08-20 | Disposition: A | Discharge status: Home / Self Care | Payer: BLUE CROSS/BLUE SHIELD | Source: Ambulatory Visit | Attending: Cardiology | Admitting: Cardiology

## 2016-08-20 DIAGNOSIS — E785 Hyperlipidemia, unspecified: Secondary | ICD-10-CM | POA: Insufficient documentation

## 2016-08-20 DIAGNOSIS — I071 Rheumatic tricuspid insufficiency: Secondary | ICD-10-CM | POA: Insufficient documentation

## 2016-08-20 DIAGNOSIS — F17200 Nicotine dependence, unspecified, uncomplicated: Secondary | ICD-10-CM | POA: Diagnosis not present

## 2016-08-20 DIAGNOSIS — R0789 Other chest pain: Secondary | ICD-10-CM | POA: Insufficient documentation

## 2016-08-20 DIAGNOSIS — I340 Nonrheumatic mitral (valve) insufficiency: Secondary | ICD-10-CM | POA: Diagnosis not present

## 2016-08-20 LAB — EXERCISE TOLERANCE TEST
CHL RATE OF PERCEIVED EXERTION: 12
CSEPEDS: 32 s
CSEPEW: 13.4 METS
Exercise duration (min): 10 min
MPHR: 178 {beats}/min
Peak HR: 173 {beats}/min
Percent HR: 97 %
Rest HR: 71 {beats}/min

## 2016-08-20 NOTE — Progress Notes (Signed)
*  PRELIMINARY RESULTS* Echocardiogram 2D Echocardiogram has been performed.  Leavy Cella 08/20/2016, 8:53 AM

## 2020-01-16 ENCOUNTER — Ambulatory Visit (INDEPENDENT_AMBULATORY_CARE_PROVIDER_SITE_OTHER): Payer: 59 | Admitting: Cardiology

## 2020-01-16 ENCOUNTER — Encounter: Payer: Self-pay | Admitting: Cardiology

## 2020-01-16 ENCOUNTER — Encounter (INDEPENDENT_AMBULATORY_CARE_PROVIDER_SITE_OTHER): Payer: Self-pay

## 2020-01-16 ENCOUNTER — Other Ambulatory Visit: Payer: Self-pay

## 2020-01-16 VITALS — BP 130/82 | HR 86 | Temp 99.5°F | Ht 65.0 in | Wt 176.2 lb

## 2020-01-16 DIAGNOSIS — R0789 Other chest pain: Secondary | ICD-10-CM

## 2020-01-16 DIAGNOSIS — Z01812 Encounter for preprocedural laboratory examination: Secondary | ICD-10-CM | POA: Diagnosis not present

## 2020-01-16 DIAGNOSIS — Z8249 Family history of ischemic heart disease and other diseases of the circulatory system: Secondary | ICD-10-CM | POA: Diagnosis not present

## 2020-01-16 DIAGNOSIS — E782 Mixed hyperlipidemia: Secondary | ICD-10-CM | POA: Diagnosis not present

## 2020-01-16 DIAGNOSIS — I209 Angina pectoris, unspecified: Secondary | ICD-10-CM | POA: Diagnosis not present

## 2020-01-16 DIAGNOSIS — R079 Chest pain, unspecified: Secondary | ICD-10-CM

## 2020-01-16 MED ORDER — METOPROLOL TARTRATE 100 MG PO TABS: ORAL_TABLET | ORAL | 0 refills | Status: AC

## 2020-01-16 NOTE — Patient Instructions (Signed)
Medication Instructions:  Take lopressor 100 mg the night before procedure, take 100 mg 2 hours prior to procedure   Labwork: bmet  Testing/Procedures: Non-Cardiac CT Angiography (CTA), is a special type of CT scan that uses a computer to produce multi-dimensional views of major blood vessels throughout the body. In CT angiography, a contrast material is injected through an IV to help visualize the blood vessels   Follow-Up: Your physician recommends that you schedule a follow-up appointment in: will call after tests    Any Other Special Instructions Will Be Listed Below (If Applicable).     If you need a refill on your cardiac medications before your next appointment, please call your pharmacy.

## 2020-01-16 NOTE — Progress Notes (Signed)
Cardiology Office Note  Date: 01/16/2020   ID: Charlene Palmer, DOB May 21, 1976, MRN [id number]  PCP:  Pablo Lawrence, NP  Cardiologist:  Rozann Lesches, MD Electrophysiologist:  None   Chief Complaint  Patient presents with  . Chest Pain    History of Present Illness: Charlene Palmer is a 44 y.o. female referred for cardiology consultation by Ms. Kari Baars NP with Dr. Nevada Crane for the evaluation of chest pain.  She states that about a month ago she was started on Abilify.  Around that time she noticed a reportedly intense feeling of chest tightness and shortness of breath, pounding heartbeat, also shoulder tightness and pain.  Abilify was stopped about a week later, symptoms persisted but have been less intense.  These episodes occur at rest, worse with exertion.  She states that she has had very mild episodes like this prior to using Abilify, not entirely clear whether there is a correlation.  She does have a family history of heart disease, was seen by our practice back in 2017 at which point she had a reassuring GXT and echocardiogram.  Reportedly ECG obtained at Dr. Juel Burrow office recently showed normal sinus rhythm, I do not have the tracing for review.  I personally reviewed her repeat tracing in the office today which shows normal sinus rhythm.  We went over her medications which are outlined below and otherwise stable.  Past Medical History:  Diagnosis Date  . Allergy   . Anxiety   . Carpal tunnel syndrome of right wrist   . Depression   . GERD (gastroesophageal reflux disease)   . Headache   . Hyperlipidemia   . Impaired fasting glucose     Past Surgical History:  Procedure Laterality Date  . CARPAL TUNNEL RELEASE Right 04/24/2015   Procedure: RIGHT CARPAL TUNNEL RELEASE;  Surgeon: Daryll Brod, MD;  Location: [facility];  Service: Orthopedics;  Laterality: Right;  . CESAREAN SECTION      Current Outpatient Medications  Medication Sig Dispense Refill  . aspirin  81 MG chewable tablet Chew by mouth daily.    . cetirizine (ZYRTEC) 10 MG tablet Take 10 mg by mouth daily.    . Eszopiclone (ESZOPICLONE) 3 MG TABS Take 3 mg by mouth at bedtime. Take immediately before bedtime    . glipiZIDE (GLUCOTROL XL) 2.5 MG 24 hr tablet Take 2.5 mg by mouth daily with breakfast.    . Icosapent Ethyl (VASCEPA) 1 g CAPS Take 1 capsule by mouth 2 (two) times daily.    Marland Kitchen MELATONIN PO Take 1 tablet by mouth at bedtime.    Marland Kitchen MOBIC 15 MG tablet Take 1 tablet by mouth as needed.    . pantoprazole (PROTONIX) 40 MG tablet Take 40 mg by mouth daily.    Marland Kitchen VITAMIN E COMPLEX PO Take 1 tablet by mouth daily.    Marland Kitchen vortioxetine HBr (TRINTELLIX) 10 MG TABS tablet Take 10 mg by mouth daily.    . metoprolol tartrate (LOPRESSOR) 100 MG tablet Please take 100 mg (1 tablet) the night before procedure, then take 100 mg (1 tablet) two hours prior to procedure. 2 tablet 0   No current facility-administered medications for this visit.   Allergies:  Statins, Vicodin [hydrocodone-acetaminophen], Augmentin [amoxicillin-pot clavulanate], Crestor [rosuvastatin], Latex, and Tetracycline   Social History: The patient  reports that she has been smoking cigarettes. She started smoking about 27 years ago. She has been smoking about 0.50 packs per day. She has never used smokeless  tobacco. She reports current alcohol use. She reports that she does not use drugs.   Family History: The patient's family history includes COPD in her sister; Diabetes in her maternal grandmother and sister; Heart attack in her father; Heart disease in her father, maternal grandmother, and mother; Heart failure in her sister; Hyperlipidemia in her father; Hypertension in her father, mother, and sister.   ROS:   Mild intermittent palpitations, no syncope.  No orthopnea or PND.  Physical Exam: VS:  BP 130/82   Pulse 86   Temp 99.5 F (37.5 C)   Ht 5' 5"  (1.651 m)   Wt 176 lb 3.2 oz (79.9 kg)   LMP 11/04/2010   SpO2 96%    BMI 29.32 kg/m , BMI Body mass index is 29.32 kg/m.  Wt Readings from Last 3 Encounters:  01/16/20 176 lb 3.2 oz (79.9 kg)  08/05/16 171 lb (77.6 kg)  05/14/16 180 lb (81.6 kg)    General: Patient appears comfortable at rest. HEENT: Conjunctiva and lids normal, wearing a mask. Neck: Supple, no elevated JVP or carotid bruits, no thyromegaly. Lungs: Clear to auscultation, nonlabored breathing at rest. Cardiac: Regular rate and rhythm, no S3 or significant systolic murmur, no pericardial rub. Abdomen: Soft, nontender, bowel sounds present. Extremities: No pitting edema, distal pulses 2+. Skin: Warm and dry. Musculoskeletal: No kyphosis. Neuropsychiatric: Alert and oriented x3, affect grossly appropriate.  ECG:  An ECG dated 06/02/2016 was personally reviewed today and demonstrated:  Normal sinus rhythm.  Recent Labwork:  No recent lab work available for review today.  Other Studies Reviewed Today:  GXT 08/20/2016:  Blood pressure demonstrated a normal response to exercise.  There was no ST segment deviation noted during stress.  Clinically and electrically negative for ischemia  Echocardiogram 08/20/2016: Study Conclusions   - Left ventricle: The cavity size was normal. Wall thickness was  increased in a pattern of mild LVH. Systolic function was normal.  The estimated ejection fraction was in the range of 60% to 65%.  The study is not technically sufficient to allow evaluation of LV  diastolic function.   Assessment and Plan:  1.  Recurring chest tightness and shoulder discomfort with associated shortness of breath, symptom onset about 1 month ago.  She relates onset to initiation of Abilify, but this medication has been subsequently discontinued and symptoms persist although with lesser intensity.  She has a family history of heart disease, also personal history of hyperlipidemia and impaired glucose tolerance.  ECG is nonspecific.  Last formal ischemic testing  was in 2017.  We will plan to proceed with a cardiac CTA for further assessment.  2.  Mixed hyperlipidemia, currently on Vascepa with follow-up by PCP. No recent lab work for review.  3.  History of GERD, she does not report dysphagia or feeling of reflux with the above described symptoms.  Medication Adjustments/Labs and Tests Ordered: Current medicines are reviewed at length with the patient today.  Concerns regarding medicines are outlined above.   Tests Ordered: Orders Placed This Encounter  Procedures  . CT CORONARY MORPH W/CTA COR W/SCORE W/CA W/CM &/OR WO/CM  . CT CORONARY FRACTIONAL FLOW RESERVE DATA PREP  . CT CORONARY FRACTIONAL FLOW RESERVE FLUID ANALYSIS  . Basic Metabolic Panel (BMET)    Medication Changes: Meds ordered this encounter  Medications  . metoprolol tartrate (LOPRESSOR) 100 MG tablet    Sig: Please take 100 mg (1 tablet) the night before procedure, then take 100 mg (1 tablet) two hours prior  to procedure.    Dispense:  2 tablet    Refill:  0    Disposition:  Follow up test results.  Signed, Satira Sark, MD, [facility] 01/16/2020 11:15 AM    [facility]. [address], [place],  [address] Phone: [phone]; Fax: [phone]

## 2020-01-17 NOTE — Addendum Note (Signed)
Addended by: Merlene Laughter on: 01/17/2020 08:41 AM   Modules accepted: Orders

## 2020-01-25 ENCOUNTER — Telehealth: Payer: Self-pay

## 2020-01-25 NOTE — Telephone Encounter (Signed)
Will forward to Jannet Askew to inquire as to when CT will be scheduled or any updates available so I can make pt aware.

## 2020-01-25 NOTE — Telephone Encounter (Signed)
Pt states she is waiting for a call ZE:[id number] angiogram  Please call [phone]   Thanks renee

## 2020-01-28 ENCOUNTER — Telehealth: Payer: Self-pay

## 2020-01-28 NOTE — Telephone Encounter (Signed)
Kathyrn Sheriff, RN  Per Billing : [employer] - auth pending # [id number] - I will give her a call as soon as we obtain authorization.   Thank you  Jannet Askew

## 2020-01-28 NOTE — Telephone Encounter (Signed)
Pt is waiting for return call [id number] angiogram.  Please call Pt [phone]   Thanks renee

## 2020-01-28 NOTE — Telephone Encounter (Signed)
01/28/20 Per Jannet Askew, insurance is still pending, will notify pt

## 2020-01-28 NOTE — Telephone Encounter (Signed)
I messaged Jannet Askew for status of insurance and when apt would be.

## 2020-01-29 NOTE — Telephone Encounter (Signed)
I left message for Charlene Palmer that her health insurance authorization is still pending and that I would call her when it is approved.

## 2020-01-31 NOTE — Telephone Encounter (Signed)
Pt's insurance, [employer], has denied procedure, appeal is filed.

## 2020-02-07 NOTE — Telephone Encounter (Signed)
Hi Romie Minus, Checking status of patients appeal for cardiac CT

## 2020-07-23 ENCOUNTER — Other Ambulatory Visit (HOSPITAL_COMMUNITY): Payer: Self-pay | Admitting: Internal Medicine

## 2020-07-23 DIAGNOSIS — Z1231 Encounter for screening mammogram for malignant neoplasm of breast: Secondary | ICD-10-CM

## 2020-11-05 DIAGNOSIS — E663 Overweight: Secondary | ICD-10-CM | POA: Insufficient documentation

## 2020-11-05 DIAGNOSIS — F17210 Nicotine dependence, cigarettes, uncomplicated: Secondary | ICD-10-CM | POA: Insufficient documentation

## 2020-11-05 DIAGNOSIS — F331 Major depressive disorder, recurrent, moderate: Secondary | ICD-10-CM | POA: Insufficient documentation

## 2020-11-05 DIAGNOSIS — I10 Essential (primary) hypertension: Secondary | ICD-10-CM | POA: Insufficient documentation

## 2020-11-05 DIAGNOSIS — M7918 Myalgia, other site: Secondary | ICD-10-CM | POA: Insufficient documentation

## 2020-11-05 DIAGNOSIS — F411 Generalized anxiety disorder: Secondary | ICD-10-CM | POA: Insufficient documentation

## 2020-11-05 DIAGNOSIS — E1169 Type 2 diabetes mellitus with other specified complication: Secondary | ICD-10-CM | POA: Insufficient documentation

## 2020-11-05 DIAGNOSIS — Z8261 Family history of arthritis: Secondary | ICD-10-CM | POA: Insufficient documentation

## 2020-12-30 DIAGNOSIS — I7300 Raynaud's syndrome without gangrene: Secondary | ICD-10-CM | POA: Insufficient documentation

## 2021-01-30 ENCOUNTER — Ambulatory Visit: Payer: 59 | Admitting: Internal Medicine

## 2021-02-06 ENCOUNTER — Ambulatory Visit: Payer: Self-pay

## 2021-02-06 ENCOUNTER — Encounter: Payer: Self-pay | Admitting: Internal Medicine

## 2021-02-06 ENCOUNTER — Other Ambulatory Visit: Payer: Self-pay

## 2021-02-06 ENCOUNTER — Ambulatory Visit (INDEPENDENT_AMBULATORY_CARE_PROVIDER_SITE_OTHER): Payer: 59 | Admitting: Internal Medicine

## 2021-02-06 VITALS — BP 144/82 | HR 71 | Ht 64.0 in | Wt 161.2 lb

## 2021-02-06 DIAGNOSIS — M79641 Pain in right hand: Secondary | ICD-10-CM | POA: Diagnosis not present

## 2021-02-06 DIAGNOSIS — M79642 Pain in left hand: Secondary | ICD-10-CM

## 2021-02-06 DIAGNOSIS — I7300 Raynaud's syndrome without gangrene: Secondary | ICD-10-CM

## 2021-02-06 DIAGNOSIS — F17210 Nicotine dependence, cigarettes, uncomplicated: Secondary | ICD-10-CM

## 2021-02-06 NOTE — Patient Instructions (Signed)
Raynaud Phenomenon  Raynaud phenomenon is a condition that affects the blood vessels (arteries) that carry blood to your fingers and toes. The arteries that supply blood to your ears, lips, nipples, or the tip of your nose might also be affected. Raynaud phenomenon causes the arteries to become narrow temporarily (spasm). As a result, the flow of blood to the affected areas is temporarily decreased. This usually occurs in response to cold temperatures or stress. During an attack, the skin in the affected areas turns white, then blue, and finally red. You may also feel tingling or numbness in those areas. Attacks usually last for only a brief period, and then the blood flow to the area returns to normal. In most cases, Raynaud phenomenon does not cause serious health problems. What are the causes? In many cases, the cause of this condition is not known. The condition may occur on its own (primary Raynaud phenomenon) or may be associated with other diseases or factors (secondary Raynaud phenomenon). Possible causes may include:  Diseases or medical conditions that damage the arteries.  Injuries and repetitive actions that hurt the hands or feet.  Being exposed to certain chemicals.  Taking medicines that narrow the arteries.  Other medical conditions, such as lupus, scleroderma, rheumatoid arthritis, thyroid problems, blood disorders, Sjogren syndrome, or atherosclerosis. What increases the risk? The following factors may make you more likely to develop this condition:  Being 86-47 years old.  Being female.  Having a family history of Raynaud phenomenon.  Living in a cold climate.  Smoking. What are the signs or symptoms? Symptoms of this condition usually occur when you are exposed to cold temperatures or when you have emotional stress. The symptoms may last for a few minutes or up to several hours. They usually affect your fingers but may also affect your toes, nipples, lips, ears,  or the tip of your nose. Symptoms may include:  Changes in skin color. The skin in the affected areas will turn pale or white. The skin may then change from white to bluish to red as normal blood flow returns to the area.  Numbness, tingling, or pain in the affected areas. In severe cases, symptoms may include:  Skin sores.  Tissues decaying and dying (gangrene). How is this diagnosed? This condition may be diagnosed based on:  Your symptoms and medical history.  A physical exam. During the exam, you may be asked to put your hands in cold water to check for a reaction to cold temperature.  Tests, such as: ? Blood tests to check for other diseases or conditions. ? A test to check the movement of blood through your arteries and veins (vascular ultrasound). ? A test in which the skin at the base of your fingernail is examined under a microscope (nailfold capillaroscopy). How is this treated? Treatment for this condition often involves making lifestyle changes and taking steps to control your exposure to cold temperatures. For more severe cases, medicine (calcium channel blockers) may be used to improve blood flow. Surgery is sometimes done to block the nerves that control the affected arteries, but this is rare. Follow these instructions at home: Avoiding cold temperatures Take these steps to avoid exposure to cold:  If possible, stay indoors during cold weather.  When you go outside during cold weather, dress in layers and wear mittens, a hat, a scarf, and warm footwear.  Wear mittens or gloves when handling ice or frozen food.  Use holders for glasses or cans containing cold drinks.  Let warm water run for a while before taking a shower or bath.  Warm up the car before driving in cold weather. Lifestyle  If possible, avoid stressful and emotional situations. Try to find ways to manage your stress, such as: ? Exercise. ? Yoga. ? Meditation. ? Biofeedback.  Do not use any  products that contain nicotine or tobacco, such as cigarettes and e-cigarettes. If you need help quitting, ask your health care provider.  Avoid secondhand smoke.  Limit your use of caffeine. ? Switch to decaffeinated coffee, tea, and soda. ? Avoid chocolate.  Avoid vibrating tools and machinery. General instructions  Protect your hands and feet from injuries, cuts, or bruises.  Avoid wearing tight rings or wristbands.  Wear loose fitting socks and comfortable, roomy shoes.  Take over-the-counter and prescription medicines only as told by your health care provider. Contact a health care provider if:  Your discomfort becomes worse despite lifestyle changes.  You develop sores on your fingers or toes that do not heal.  Your fingers or toes turn black.  You have breaks in the skin on your fingers or toes.  You have a fever.  You have pain or swelling in your joints.  You have a rash.  Your symptoms occur on only one side of your body. Summary  Raynaud phenomenon is a condition that affects the arteries that carry blood to your fingers, toes, ears, lips, nipples, or the tip of your nose.  In many cases, the cause of this condition is not known.  Symptoms of this condition include changes in skin color, and numbness and tingling of the affected area.  Treatment for this condition includes lifestyle changes, reducing exposure to cold temperatures, and using medicines for severe cases of the condition.  Contact your health care provider if your condition worsens despite treatment. This information is not intended to replace advice given to you by your health care provider. Make sure you discuss any questions you have with your health care provider. Document Revised: 02/07/2020 Document Reviewed: 02/07/2020 Elsevier Patient Education  [place].

## 2021-02-06 NOTE — Progress Notes (Signed)
Office Visit Note  Patient: Charlene Palmer             Date of Birth: Sep 19, 1976           MRN: [id number]             PCP: Pablo Lawrence, NP Referring: Pablo Lawrence, NP Visit Date: 02/06/2021 Occupation: Dental assistant  Subjective:  New Patient (Initial Visit) (Patient complains of bilateral elbow, bilateral hand, bilateral hip, and bilateral knee pain, stiffness, and swelling. )   History of Present Illness: Charlene Palmer is a 45 y.o. female here for evaluation of positive biomarker testing for rheumatoid arthritis. She was previously evaluated with rheumatology about 10 years ago for joint pains but not felt to have an inflammatory problem at the time and did not go onto any treatment for this. 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 She also describes increase problem with discoloration in her hands with erythema most of the time except for pallor and cyanosis intermittently, often provoked with cold exposure. She uses NSAIDs as needed with fair benefit in symptoms. She smokes cigarettes daily. She takes clonidine for hot flashes that have been ongoing almost 10 years since having partial hysterectomy.  Activities of Daily Living:  Patient reports morning stiffness for several hours.   Patient Reports nocturnal pain.  Difficulty dressing/grooming: Denies Difficulty climbing stairs: Denies Difficulty getting out of chair: Denies Difficulty using hands for taps, buttons, cutlery, and/or writing: Reports  Review of Systems  Constitutional: Positive for fatigue.  HENT: Negative for mouth sores, mouth dryness and nose dryness.   Eyes: Positive for pain, itching and visual disturbance. Negative for dryness.  Respiratory: Negative for cough, hemoptysis, shortness of breath and difficulty breathing.   Cardiovascular: Positive for  chest pain, palpitations and swelling in legs/feet.  Gastrointestinal: Positive for constipation and diarrhea. Negative for abdominal pain and blood in stool.  Endocrine: Negative for increased urination.  Genitourinary: Negative for painful urination.  Musculoskeletal: Positive for arthralgias, joint pain, joint swelling, myalgias, muscle weakness, morning stiffness, muscle tenderness and myalgias.  Skin: Positive for color change, rash and redness.  Allergic/Immunologic: Positive for susceptible to infections.  Neurological: Positive for dizziness, numbness, headaches, memory loss and weakness.  Hematological: Negative for swollen glands.  Psychiatric/Behavioral: Positive for sleep disturbance. Negative for confusion.    PMFS History:  Patient Active Problem List   Diagnosis Date Noted  . Bilateral hand pain 02/06/2021  . Raynaud's disease without gangrene 12/30/2020  . Benign essential hypertension 11/05/2020  . Cigarette nicotine dependence without complication 62/22/9798  . DM type 2 with diabetic mixed hyperlipidemia ([facility]) 11/05/2020  . Family history of rheumatoid arthritis 11/05/2020  . Generalized anxiety disorder 11/05/2020  . Major depressive disorder, recurrent episode, moderate ([place]) 11/05/2020  . Myalgia, other site 11/05/2020  . Overweight with body mass index (BMI) 25.0-29.9 11/05/2020  . Elevated blood pressure reading 08/04/2016  . GERD (gastroesophageal reflux disease) 08/04/2016  . Mixed hyperlipidemia 08/04/2016  . Primary insomnia 08/04/2016  . SHOULDER PAIN 12/12/2008    Past Medical History:  Diagnosis Date  . Allergy   . Anxiety   . Carpal tunnel syndrome of right wrist   . Chronic insomnia   . Depression   . GERD (gastroesophageal reflux disease)   . Headache   . Hyperlipidemia   . Impaired fasting glucose  Family History  Problem Relation Age of Onset  . Heart disease Mother   . Hypertension Mother   . Diabetes Mother   . Stroke Mother    . Heart disease Father   . Hyperlipidemia Father   . Heart attack Father   . Heart failure Sister   . Hypertension Sister   . Diabetes Sister   . COPD Sister   . Heart disease Maternal Grandmother   . Diabetes Maternal Grandmother   . Healthy Brother   . Healthy Brother   . Healthy Daughter    Past Surgical History:  Procedure Laterality Date  . CARPAL TUNNEL RELEASE Right 04/24/2015   Procedure: RIGHT CARPAL TUNNEL RELEASE;  Surgeon: Daryll Brod, MD;  Location: [facility];  Service: Orthopedics;  Laterality: Right;  . CESAREAN SECTION    . EYE SURGERY Right    At birth, patient is unsure of details   Social History   Social History Narrative  . Not on file   Immunization History  Administered Date(s) Administered  . Moderna Sars-Covid-2 Vaccination 02/22/2020, 03/31/2020, 10/17/2020     Objective: Vital Signs: BP (!) 144/82 (BP Location: Right Arm, Patient Position: Sitting, Cuff Size: Normal)   Pulse 71   Ht 5' 4"  (1.626 m)   Wt 161 lb 3.2 oz (73.1 kg)   LMP 11/04/2010   BMI 27.67 kg/m    Physical Exam HENT:     Right Ear: External ear normal.     Left Ear: External ear normal.     Mouth/Throat:     Mouth: Mucous membranes are moist.     Pharynx: Oropharynx is clear.  Eyes:     Conjunctiva/sclera: Conjunctivae normal.  Cardiovascular:     Rate and Rhythm: Normal rate and regular rhythm.  Pulmonary:     Effort: Pulmonary effort is normal.     Breath sounds: Normal breath sounds.  Skin:    General: Skin is warm and dry.     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     Musculoskeletal Exam:  Neck full ROM no tenderness Shoulders full ROM no tenderness or swelling Elbows full ROM no tenderness or swelling Wrists full ROM no  tenderness or swelling Fingers full ROM no tenderness or swelling, slight widening of PIP and DIP joints without palpable synovitis or tenderness Knees full ROM no tenderness or swelling Ankles full ROM no tenderness or swelling MTPs full ROM no tenderness or swelling  Investigation: No additional findings.  Imaging: XR Hand 2 View Left  Result Date: 02/06/2021 X-ray left hand 2 views Radiocarpal joint space and ulnar styloid appear normal.  Multiple cystic changes throughout carpal bones.  Degenerative changes of first [facility] joint with slight thumb subluxation.  MCP joints appear normal.  Mild asymmetric joint space narrowing and sclerosis in PIP and DIP joints with early DIP lateral osteophyte formation.  Bone mineralization appears normal. 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  Multiple cystic changes in carpal bones.  Mild arthritis of first [facility] joint is present.  MCP joints appear normal.  Asymmetric joint space narrowing of several  PIP and DIP joints with early osteophyte formation.  Bone mineralization appears normal. 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  Procedures:  No procedures performed Allergies: Statins, Vicodin [hydrocodone-acetaminophen], Augmentin [amoxicillin-pot clavulanate], Crestor [rosuvastatin], Latex, Tetracycline, and Abilify [aripiprazole]   Assessment / Plan:     Visit Diagnoses: Bilateral hand pain - Plan: XR Hand 2 View  Right, XR Hand 2 View Left  Bilateral hand pain with possible RA based on RF positive with reported swelling and stiffness although no obvious inflammatory change seen on exam today. Checking bilateral hand xrays for inflammatory changes or alternate causes. She reports varying symptom severity today is less so will also plan to follow up with repeat exam and possible ultrasound inspection of hands.  Raynaud's disease without gangrene  Digital cyanosis with pallor described with cold exposure limited to the fingers. No pitting or lesions are seen on fingers or toes. She reports being on a calcium channel blocker for this but I do not see in medication list currently.  Cigarette nicotine dependence without complication  Discussed relationship of nicotine use with raynaud's symptoms and development and severity of rheumatoid arthritis. She is not actively in a plan to quit at this time.  Orders: Orders Placed This Encounter  Procedures  . XR Hand 2 View Right  . XR Hand 2 View Left   No orders of the defined types were placed in this encounter.    Follow-Up Instructions: Return in about 3 weeks (around 02/27/2021) for RF+ possible inflammatory arthritis, +OA, +Raynauds.   Collier Salina, MD  Note - This record has been created using [employer].  Chart creation errors have been sought, but may not always  have been located. Such creation errors do not reflect on  the standard of medical care.

## 2021-02-08 ENCOUNTER — Other Ambulatory Visit: Payer: Self-pay

## 2021-02-08 ENCOUNTER — Emergency Department (HOSPITAL_COMMUNITY): Payer: 59

## 2021-02-08 ENCOUNTER — Encounter (HOSPITAL_COMMUNITY): Payer: Self-pay

## 2021-02-08 ENCOUNTER — Emergency Department (HOSPITAL_COMMUNITY)
Admission: EM | Admit: 2021-02-08 | Discharge: 2021-02-09 | Disposition: A | Discharge status: Home / Self Care | Payer: 59 | Attending: Emergency Medicine | Admitting: Emergency Medicine

## 2021-02-08 DIAGNOSIS — R300 Dysuria: Secondary | ICD-10-CM | POA: Diagnosis not present

## 2021-02-08 DIAGNOSIS — Z91040 Latex allergy status: Secondary | ICD-10-CM | POA: Diagnosis not present

## 2021-02-08 DIAGNOSIS — Z20822 Contact with and (suspected) exposure to covid-19: Secondary | ICD-10-CM | POA: Diagnosis not present

## 2021-02-08 DIAGNOSIS — N83202 Unspecified ovarian cyst, left side: Secondary | ICD-10-CM | POA: Diagnosis not present

## 2021-02-08 DIAGNOSIS — F17210 Nicotine dependence, cigarettes, uncomplicated: Secondary | ICD-10-CM | POA: Insufficient documentation

## 2021-02-08 DIAGNOSIS — E119 Type 2 diabetes mellitus without complications: Secondary | ICD-10-CM | POA: Insufficient documentation

## 2021-02-08 DIAGNOSIS — D72829 Elevated white blood cell count, unspecified: Secondary | ICD-10-CM | POA: Diagnosis not present

## 2021-02-08 DIAGNOSIS — R1032 Left lower quadrant pain: Secondary | ICD-10-CM | POA: Diagnosis present

## 2021-02-08 DIAGNOSIS — I10 Essential (primary) hypertension: Secondary | ICD-10-CM | POA: Insufficient documentation

## 2021-02-08 DIAGNOSIS — R102 Pelvic and perineal pain: Secondary | ICD-10-CM

## 2021-02-08 LAB — RESP PANEL BY RT-PCR (FLU A&B, COVID) ARPGX2
Influenza A by PCR: NEGATIVE
Influenza B by PCR: NEGATIVE
SARS Coronavirus 2 by RT PCR: NEGATIVE

## 2021-02-08 LAB — CBC WITH DIFFERENTIAL/PLATELET
Abs Immature Granulocytes: 0.05 10*3/uL (ref 0.00–0.07)
Basophils Absolute: 0 10*3/uL (ref 0.0–0.1)
Basophils Relative: 0 %
Eosinophils Absolute: 0.1 10*3/uL (ref 0.0–0.5)
Eosinophils Relative: 1 %
HCT: 41.7 % (ref 36.0–46.0)
Hemoglobin: 13.9 g/dL (ref 12.0–15.0)
Immature Granulocytes: 0 %
Lymphocytes Relative: 22 %
Lymphs Abs: 3.2 10*3/uL (ref 0.7–4.0)
MCH: 32.8 pg (ref 26.0–34.0)
MCHC: 33.3 g/dL (ref 30.0–36.0)
MCV: 98.3 fL (ref 80.0–100.0)
Monocytes Absolute: 0.8 10*3/uL (ref 0.1–1.0)
Monocytes Relative: 5 %
Neutro Abs: 10.5 10*3/uL — ABNORMAL HIGH (ref 1.7–7.7)
Neutrophils Relative %: 72 %
Platelets: 321 10*3/uL (ref 150–400)
RBC: 4.24 MIL/uL (ref 3.87–5.11)
RDW: 12.8 % (ref 11.5–15.5)
WBC: 14.7 10*3/uL — ABNORMAL HIGH (ref 4.0–10.5)
nRBC: 0 % (ref 0.0–0.2)

## 2021-02-08 LAB — COMPREHENSIVE METABOLIC PANEL
ALT: 17 U/L (ref 0–44)
AST: 14 U/L — ABNORMAL LOW (ref 15–41)
Albumin: 3.7 g/dL (ref 3.5–5.0)
Alkaline Phosphatase: 71 U/L (ref 38–126)
Anion gap: 8 (ref 5–15)
BUN: 11 mg/dL (ref 6–20)
CO2: 25 mmol/L (ref 22–32)
Calcium: 8.7 mg/dL — ABNORMAL LOW (ref 8.9–10.3)
Chloride: 105 mmol/L (ref 98–111)
Creatinine, Ser: 0.55 mg/dL (ref 0.44–1.00)
GFR, Estimated: >60 mL/min (ref >60)
Glucose, Bld: 144 mg/dL — ABNORMAL HIGH (ref 70–99)
Potassium: 3.7 mmol/L (ref 3.5–5.1)
Sodium: 138 mmol/L (ref 135–145)
Total Bilirubin: 0.4 mg/dL (ref 0.3–1.2)
Total Protein: 6.7 g/dL (ref 6.5–8.1)

## 2021-02-08 LAB — URINALYSIS, ROUTINE W REFLEX MICROSCOPIC
Bilirubin Urine: NEGATIVE
Glucose, UA: NEGATIVE mg/dL
Ketones, ur: NEGATIVE mg/dL
Leukocytes,Ua: NEGATIVE
Nitrite: NEGATIVE
Protein, ur: NEGATIVE mg/dL
Specific Gravity, Urine: 1.005 (ref 1.005–1.030)
pH: 6 (ref 5.0–8.0)

## 2021-02-08 LAB — LIPASE, BLOOD: Lipase: 28 U/L (ref 11–51)

## 2021-02-08 MED ORDER — MORPHINE SULFATE (PF) 4 MG/ML IV SOLN
4.0000 mg | Freq: Once | INTRAVENOUS | Status: AC
  Administered 2021-02-08: 4 mg via INTRAVENOUS
  Filled 2021-02-08: qty 1

## 2021-02-08 MED ORDER — ONDANSETRON HCL 4 MG/2ML IJ SOLN
4.0000 mg | Freq: Once | INTRAMUSCULAR | Status: AC
  Administered 2021-02-08: 4 mg via INTRAVENOUS
  Filled 2021-02-08: qty 2

## 2021-02-08 MED ORDER — KETOROLAC TROMETHAMINE 30 MG/ML IJ SOLN
30.0000 mg | Freq: Once | INTRAMUSCULAR | Status: AC
  Administered 2021-02-08: 30 mg via INTRAVENOUS
  Filled 2021-02-08: qty 1

## 2021-02-08 NOTE — ED Provider Notes (Signed)
  Provider Note MRN:  [id number]  Arrival date & time: 02/09/21    ED Course and Medical Decision Making  Assumed care from Dr. Laverta Baltimore at shift change.  Flank pain with CT showing no evidence of stone, there is evidence of left ovarian cyst, will follow-up with ultrasound exclude torsion.  No evidence of torsion on ultrasound, suspect symptoms are related to symptomatic cyst.  Patient is feeling better on my reassessment, appropriate for discharge.  Procedures  Final Clinical Impressions(s) / ED Diagnoses     ICD-10-CM   1. Cyst of left ovary  N83.202   2. Pelvic pain  R10.2 US PELVIC COMPLETE W TRANSVAGINAL AND TORSION R/O    US PELVIC COMPLETE W TRANSVAGINAL AND TORSION R/O    ED Discharge Orders         Ordered    oxyCODONE (ROXICODONE) 5 MG immediate release tablet  Every 4 hours PRN        02/09/21 0225            Discharge Instructions     You were evaluated in the Emergency Department and after careful evaluation, we did not find any emergent condition requiring admission or further testing in the hospital.  Your exam/testing today was overall reassuring.  Symptoms seem to be due to an ovarian cyst.  Recommend Tylenol 1000 mg every 4-6 hours and/or ibuprofen 600 mg every 4-6 hours.  Can use the oxycodone medication as needed for more significant pain.  Recommend follow-up with OB/GYN.  Please return to the Emergency Department if you experience any worsening of your condition.  Thank you for allowing [place] to be a part of your care.      Barth Kirks. Sedonia Small, [place] [email or web address]    Maudie Flakes, MD 02/09/21 [phone]

## 2021-02-08 NOTE — ED Triage Notes (Signed)
pov from home with cc left side flank pain that started around 6pm this evening. Also states painful urination. Denies hx of kidney stones

## 2021-02-08 NOTE — ED Provider Notes (Signed)
Emergency Department Provider Note   I have reviewed the triage vital signs and the nursing notes.   HISTORY  Chief Complaint Flank Pain (left)   HPI Charlene Palmer is a 45 y.o. female with past medical history reviewed below presents to the emergency department with acute onset left flank pain and dysuria.  Patient states she had a bowel movement which was not painful but shortly afterwards developed severe pain in the left lower abdomen radiating to the left flank.  Symptoms are intermittently more severe.  She has never had kidney stones or similar pain in the past.  She has had urine infections and was treated recently, 1 month prior, with antibiotics and made a recovery.  After the pain developed she did notice some severe dysuria but no gross blood. No fever or chills. She is not anticoagulated. Pain is severe and sharp.    Past Medical History:  Diagnosis Date  . Allergy   . Anxiety   . Carpal tunnel syndrome of right wrist   . Chronic insomnia   . Depression   . GERD (gastroesophageal reflux disease)   . Headache   . Hyperlipidemia   . Impaired fasting glucose     Patient Active Problem List   Diagnosis Date Noted  . Bilateral hand pain 02/06/2021  . Raynaud's disease without gangrene 12/30/2020  . Benign essential hypertension 11/05/2020  . Cigarette nicotine dependence without complication 16/07/9603  . DM type 2 with diabetic mixed hyperlipidemia ([place]) 11/05/2020  . Family history of rheumatoid arthritis 11/05/2020  . Generalized anxiety disorder 11/05/2020  . Major depressive disorder, recurrent episode, moderate ([place]) 11/05/2020  . Myalgia, other site 11/05/2020  . Overweight with body mass index (BMI) 25.0-29.9 11/05/2020  . Elevated blood pressure reading 08/04/2016  . GERD (gastroesophageal reflux disease) 08/04/2016  . Mixed hyperlipidemia 08/04/2016  . Primary insomnia 08/04/2016  . SHOULDER PAIN 12/12/2008    Past Surgical History:  Procedure  Laterality Date  . CARPAL TUNNEL RELEASE Right 04/24/2015   Procedure: RIGHT CARPAL TUNNEL RELEASE;  Surgeon: Daryll Brod, MD;  Location: [facility];  Service: Orthopedics;  Laterality: Right;  . CESAREAN SECTION    . EYE SURGERY Right    At birth, patient is unsure of details    Allergies Statins, Vicodin [hydrocodone-acetaminophen], Augmentin [amoxicillin-pot clavulanate], Crestor [rosuvastatin], Latex, Tetracycline, and Abilify [aripiprazole]  Family History  Problem Relation Age of Onset  . Heart disease Mother   . Hypertension Mother   . Diabetes Mother   . Stroke Mother   . Heart disease Father   . Hyperlipidemia Father   . Heart attack Father   . Heart failure Sister   . Hypertension Sister   . Diabetes Sister   . COPD Sister   . Heart disease Maternal Grandmother   . Diabetes Maternal Grandmother   . Healthy Brother   . Healthy Brother   . Healthy Daughter     Social History Social History   Tobacco Use  . Smoking status: Current Every Day Smoker    Packs/day: 0.50    Types: Cigarettes    Start date: 08/05/1992  . Smokeless tobacco: Never Used  Vaping Use  . Vaping Use: Never used  Substance Use Topics  . Alcohol use: Yes    Comment: Moderaltely   . Drug use: No    Review of Systems  Constitutional: No fever/chills Eyes: No visual changes. ENT: No sore throat. Cardiovascular: Denies chest pain. Respiratory: Denies shortness of breath. Gastrointestinal:  Positive LLQ abdominal/fklank pain.  No nausea, no vomiting.  No diarrhea.  No constipation. Genitourinary: Positive for dysuria. Musculoskeletal: Negative for back pain. Skin: Negative for rash.  Neurological: Negative for headaches, focal weakness or numbness.  10-point ROS otherwise negative.  ____________________________________________   PHYSICAL EXAM:  VITAL SIGNS: ED Triage Vitals [02/08/21 2102]  Enc Vitals Group     BP (!) 166/91     Pulse Rate 79     Resp 17      Temp 98.8 F (37.1 C)     Temp src      SpO2 100 %     Weight 161 lb 3.2 oz (73.1 kg)     Height 5' 4"  (1.626 m)   Constitutional: Alert and oriented. Appears somewhat uncomfortable but able to provide a full history.  Eyes: Conjunctivae are normal.  Head: Atraumatic. Nose: No congestion/rhinnorhea. Mouth/Throat: Mucous membranes are moist.   Neck: No stridor.  Cardiovascular: Normal rate, regular rhythm. Good peripheral circulation. Grossly normal heart sounds.   Respiratory: Normal respiratory effort.  No retractions. Lungs CTAB. Gastrointestinal: Soft with no focal tenderness in any abdominal quadrant, specifically in the LLQ. No distention.  Musculoskeletal: No gross deformities of extremities. Neurologic:  Normal speech and language.  Skin:  Skin is warm, dry and intact. No rash noted.  ____________________________________________   LABS (all labs ordered are listed, but only abnormal results are displayed)  Labs Reviewed  URINALYSIS, ROUTINE W REFLEX MICROSCOPIC - Abnormal; Notable for the following components:      Result Value   Color, Urine STRAW (*)    Hgb urine dipstick MODERATE (*)    Bacteria, UA RARE (*)    All other components within normal limits  COMPREHENSIVE METABOLIC PANEL - Abnormal; Notable for the following components:   Glucose, Bld 144 (*)    Calcium 8.7 (*)    AST 14 (*)    All other components within normal limits  CBC WITH DIFFERENTIAL/PLATELET - Abnormal; Notable for the following components:   WBC 14.7 (*)    Neutro Abs 10.5 (*)    All other components within normal limits  RESP PANEL BY RT-PCR (FLU A&B, COVID) ARPGX2  URINE CULTURE  LIPASE, BLOOD   ____________________________________________  RADIOLOGY  CT Renal Stone Study  Result Date: 02/08/2021 CLINICAL DATA:  Pain which began in left groin now radiating to left flank and lower back EXAM: CT ABDOMEN AND PELVIS WITHOUT CONTRAST TECHNIQUE: Multidetector CT imaging of the abdomen and  pelvis was performed following the standard protocol without IV contrast. COMPARISON:  None. FINDINGS: Lower chest: Lung bases are clear. Normal heart size. No pericardial effusion. Hepatobiliary: No worrisome focal liver abnormality is seen. Normal gallbladder. No visible calcified gallstones. No biliary ductal dilatation. Pancreas: No discernible pancreatic mass or peripancreatic inflammation. Slight truncation of the pancreatic tail. No pancreatic ductal dilatation. Spleen: Normal in size. No concerning splenic lesions. Adrenals/Urinary Tract: Normal adrenal glands. Kidneys symmetric in size and normally located. No visible or contour deforming renal lesions. No urolithiasis or hydronephrosis. Urinary bladder is unremarkable. Stomach/Bowel: Stomach is within normal limits. Appendix appears normal. No evidence of bowel wall thickening, distention, or inflammatory changes. Vascular/Lymphatic: Atherosclerotic calcifications within the abdominal aorta and branch vessels. No aneurysm or ectasia. No enlarged abdominopelvic lymph nodes. Reproductive: Fluid attenuation cysts are present in the asymmetrically enlarged left ovary measuring up to 2.8 and 2.9 cm in size. No concerning or conspicuous right adnexal lesions. Prior hysterectomy. Other: Small volume of simple attenuation free fluid  in the deep pelvis and adnexa. Low-attenuation circumscribed fat attenuation structure is seen along the left pelvic sidewall (2/73), nonspecific, though possibly fluid within the planes of the low pelvis or small focus of fat necrosis. Musculoskeletal: Multilevel degenerative changes are present in the imaged portions of the spine. Rudimentary ribs T12. Levocurvature lumbar spine L3. Transitional L5 vertebrae. IMPRESSION: No urolithiasis or hydronephrosis. Asymmetrically enlarged left ovary with 2 small cysts measuring up to 2.9 cm in size. Given that this finding is ipsilateral to the patient's pain, could consider further evaluation  with pelvic ultrasound and Doppler to assess for acute ovarian pathology or possible torsion. Small volume of low-attenuation fluid in the deep pelvis may be reactive or physiologic. Circumscribed focus of fat along the left pelvic sidewall (2/73) could reflect some layering/insinuating fluid versus fat necrosis or some reactive process from the adjacent ovary. Electronically Signed   By: Lovena Le M.D.   On: 02/08/2021 22:24   US PELVIC COMPLETE W TRANSVAGINAL AND TORSION R/O  Result Date: 02/09/2021 CLINICAL DATA:  Initial evaluation for acute left lower quadrant pelvic pain. History of prior hysterectomy. Patient is postmenopausal per history. EXAM: TRANSABDOMINAL AND TRANSVAGINAL ULTRASOUND OF PELVIS DOPPLER ULTRASOUND OF OVARIES TECHNIQUE: Both transabdominal and transvaginal ultrasound examinations of the pelvis were performed. Transabdominal technique was performed for global imaging of the pelvis including uterus, ovaries, adnexal regions, and pelvic cul-de-sac. It was necessary to proceed with endovaginal exam following the transabdominal exam to visualize the uterus, endometrium, and ovaries. Color and duplex Doppler ultrasound was utilized to evaluate blood flow to the ovaries. COMPARISON:  Prior CT from earlier the same day. FINDINGS: Uterus Prior hysterectomy.  No abnormality about the vaginal cuff. Endometrium Surgically absent. Right ovary Not visualized.  No adnexal mass. Left ovary Measurements: 6.9 x 4.4 x 3.9 cm = volume: 61.7 mL. 2.5 x 2.7 x 2.5 cm simple anechoic cyst. Adjacent 2.4 x 2.6 x 2.1 cm simple anechoic cyst. No internal complexity, vascularity, or solid nodularity. Pulsed Doppler evaluation of the left ovary demonstrates small volume free fluid within the pelvis. Other findings No abnormal free fluid. IMPRESSION: 1. Enlarged left ovary with two simple left ovarian cysts measuring up to 2.7 cm as above, corresponding with findings on prior CT. These have benign features, with no  followup imaging recommended. Note: This recommendation does not apply to premenarchal patients or to those with increased risk (genetic, family history, elevated tumor markers or other high-risk factors) of ovarian cancer. Reference: Radiology 2019 Nov; 293(2):359-371. 2. No evidence for ovarian torsion by sonography. 3. Small volume free fluid within the pelvis. 4. Prior hysterectomy. Electronically Signed   By: Jeannine Boga M.D.   On: 02/09/2021 00:11    ____________________________________________   PROCEDURES  Procedure(s) performed:   Procedures  None  ____________________________________________   INITIAL IMPRESSION / ASSESSMENT AND PLAN / ED COURSE  Pertinent labs & imaging results that were available during my care of the patient were reviewed by me and considered in my medical decision making (see chart for details).   Patient presents to the emergency department for evaluation of left lower quadrant abdominal pain which began suddenly.  She does have a history of hysterectomy and no longer has menstrual cycles in the setting.  Pain onset is typical for kidney stone.  Patient also with some dysuria which started shortly afterward.  Symptoms did happen in fairly close proximity to having a bowel movement but the bowel movement itself was not painful.  Lower suspicion for diverticulitis  with minimal to no tenderness on exam. Plan for labs, UA, and CT renal.   10:32 PM  Patient CT renal shows an enlarged ovary with cyst.  No ureteral stone or diverticulitis.  Lab work with some mild leukocytosis.  Radiology is recommending Doppler to assess for possible torsion.  Have notified the secretary to page the ultrasound tech on call for emergent [place].   Care transferred to Dr. Sedonia Small pending [place] results.  ____________________________________________  FINAL CLINICAL IMPRESSION(S) / ED DIAGNOSES  Final diagnoses:  Cyst of left ovary     MEDICATIONS GIVEN DURING THIS  VISIT:  Medications  ketorolac (TORADOL) 30 MG/ML injection 30 mg (30 mg Intravenous Given 02/08/21 2134)  morphine 4 MG/ML injection 4 mg (4 mg Intravenous Given 02/08/21 2225)  ondansetron (ZOFRAN) injection 4 mg (4 mg Intravenous Given 02/08/21 2219)  oxyCODONE (Oxy IR/ROXICODONE) immediate release tablet 5 mg (5 mg Oral Given 02/09/21 0056)  fentaNYL (SUBLIMAZE) injection 50 mcg (50 mcg Intravenous Given 02/09/21 0133)     NEW OUTPATIENT MEDICATIONS STARTED DURING THIS VISIT:  Discharge Medication List as of 02/09/2021  2:26 AM    START taking these medications   Details  oxyCODONE (ROXICODONE) 5 MG immediate release tablet Take 1 tablet (5 mg total) by mouth every 4 (four) hours as needed for severe pain., Starting Mon 02/09/2021, Normal        Note:  This document was prepared using Dragon voice recognition software and may include unintentional dictation errors.  Nanda Quinton, MD, [facility]    Jeydi Klingel, [facility], MD 02/09/21 [phone]

## 2021-02-09 MED ORDER — OXYCODONE HCL 5 MG PO TABS: 5.0000 mg | ORAL_TABLET | ORAL | 0 refills | Status: AC | PRN

## 2021-02-09 MED ORDER — OXYCODONE HCL 5 MG PO TABS
5.0000 mg | ORAL_TABLET | Freq: Once | ORAL | Status: AC
  Administered 2021-02-09: 5 mg via ORAL
  Filled 2021-02-09: qty 1

## 2021-02-09 MED ORDER — FENTANYL CITRATE (PF) 100 MCG/2ML IJ SOLN
50.0000 ug | Freq: Once | INTRAMUSCULAR | Status: AC
  Administered 2021-02-09: 50 ug via INTRAVENOUS
  Filled 2021-02-09: qty 2

## 2021-02-09 NOTE — Discharge Instructions (Addendum)
You were evaluated in the Emergency Department and after careful evaluation, we did not find any emergent condition requiring admission or further testing in the hospital.  Your exam/testing today was overall reassuring.  Symptoms seem to be due to an ovarian cyst.  Recommend Tylenol 1000 mg every 4-6 hours and/or ibuprofen 600 mg every 4-6 hours.  Can use the oxycodone medication as needed for more significant pain.  Recommend follow-up with OB/GYN.  Please return to the Emergency Department if you experience any worsening of your condition.  Thank you for allowing [place] to be a part of your care.

## 2021-02-10 LAB — URINE CULTURE: Culture: <10000 — AB

## 2021-12-25 ENCOUNTER — Encounter: Payer: Self-pay | Admitting: Podiatry

## 2021-12-25 ENCOUNTER — Other Ambulatory Visit: Payer: Self-pay

## 2021-12-25 ENCOUNTER — Ambulatory Visit (INDEPENDENT_AMBULATORY_CARE_PROVIDER_SITE_OTHER): Payer: Commercial Managed Care - PPO

## 2021-12-25 ENCOUNTER — Ambulatory Visit: Payer: Commercial Managed Care - PPO | Admitting: Podiatry

## 2021-12-25 DIAGNOSIS — M722 Plantar fascial fibromatosis: Secondary | ICD-10-CM | POA: Diagnosis not present

## 2021-12-25 MED ORDER — TRIAMCINOLONE ACETONIDE 10 MG/ML IJ SUSP
10.0000 mg | Freq: Once | INTRAMUSCULAR | Status: AC
  Administered 2021-12-25: 10 mg

## 2021-12-25 NOTE — Patient Instructions (Signed)

## 2021-12-27 NOTE — Progress Notes (Signed)
Subjective:  ? ?Patient ID: Charlene Palmer, female   DOB: 46 y.o.   MRN: [id number]  ? ?HPI ?Patient presents stating she has developed pain in her left heel that has been very tender and she thinks she had an injury which was the start of the condition.  States that its been sore when she gets up and after periods of sitting and also getting pain in the outside of the foot.  Patient smokes 1/2 pack of cigarettes per day ? ? ?Review of Systems  ?All other systems reviewed and are negative. ? ? ?   ?Objective:  ?Physical Exam ?Vitals and nursing note reviewed.  ?Constitutional:   ?   Appearance: She is well-developed.  ?Pulmonary:  ?   Effort: Pulmonary effort is normal.  ?Musculoskeletal:     ?   General: Normal range of motion.  ?Skin: ?   General: Skin is warm.  ?Neurological:  ?   Mental Status: She is alert.  ?  ?Neurovascular status intact muscle strength is found to be adequate range of motion adequate with exquisite discomfort plantar aspect left heel at the insertional point tendon calcaneus with moderate depression of the arch noted and good digital perfusion ? ?   ?Assessment:  ?Acute plantar fasciitis left with inflammation fluid in the medial band ? ?   ?Plan:  ?H&P reviewed condition discussed procedure to work on this and today did sterile prep and injected the medial band at insertion 3 mg Kenalog 5 mg Xylocaine applied fascial brace to lift up the arch and take stress off the plantar heel and instructed on physical therapy.  Reappoint to recheck ? ?X-rays indicate that there is small spur no indication stress fracture arthritis ?   ? ? ?

## 2022-01-08 ENCOUNTER — Ambulatory Visit: Payer: Commercial Managed Care - PPO | Admitting: Podiatry

## 2022-02-01 ENCOUNTER — Other Ambulatory Visit (HOSPITAL_COMMUNITY): Payer: Self-pay | Admitting: Adult Health Nurse Practitioner

## 2022-02-01 DIAGNOSIS — Z1231 Encounter for screening mammogram for malignant neoplasm of breast: Secondary | ICD-10-CM

## 2022-02-01 LAB — EXTERNAL GENERIC LAB PROCEDURE: COLOGUARD: NEGATIVE

## 2022-02-01 LAB — COLOGUARD: COLOGUARD: NEGATIVE

## 2022-03-16 ENCOUNTER — Other Ambulatory Visit (HOSPITAL_COMMUNITY): Payer: Self-pay | Admitting: Adult Health Nurse Practitioner

## 2022-03-16 DIAGNOSIS — Z1231 Encounter for screening mammogram for malignant neoplasm of breast: Secondary | ICD-10-CM

## 2022-10-12 ENCOUNTER — Other Ambulatory Visit (HOSPITAL_COMMUNITY): Payer: Self-pay | Admitting: Adult Health Nurse Practitioner

## 2022-10-12 DIAGNOSIS — G8929 Other chronic pain: Secondary | ICD-10-CM

## 2022-10-20 IMAGING — CT CT RENAL STONE PROTOCOL
2 of 4 series · 16 of 46 positions shown, 18 images · non-contrast
Comparison: None.

CLINICAL DATA: Pain which began in left groin now radiating to left
flank and lower back

EXAM:
CT ABDOMEN AND PELVIS WITHOUT CONTRAST
TECHNIQUE: Multidetector CT imaging of the abdomen and pelvis was performed
following the standard protocol without IV contrast.

[Series 2: axial st · axial · 0.80mm/px · z∈[+864,+1284]mm · 13 of 96 slices shown, 15 images]
[im 6/96  soft-tissue]
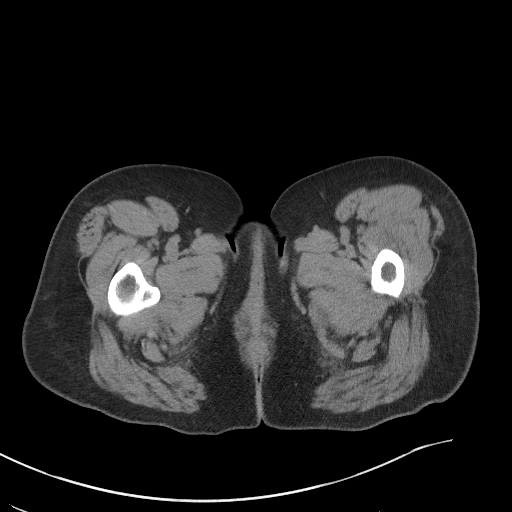
[im 6/96  bone]
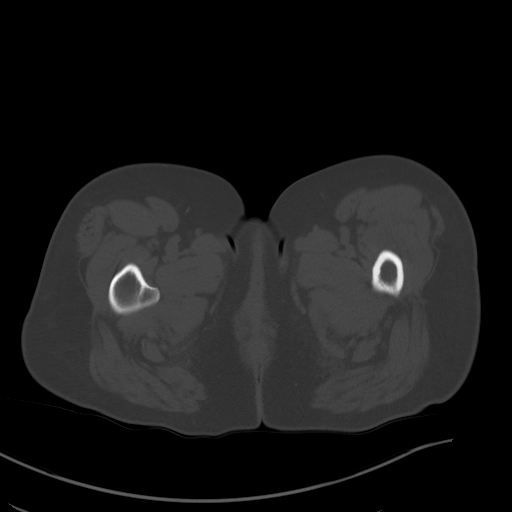
[im 12/96  soft-tissue]
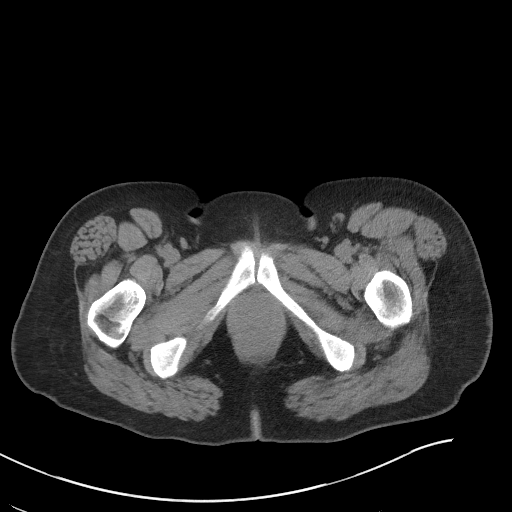
[im 23/96  soft-tissue]
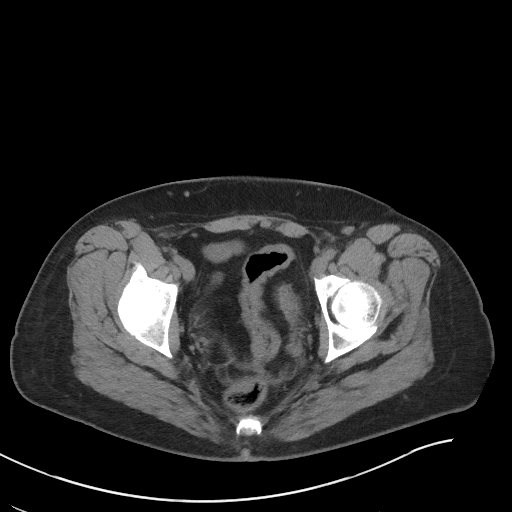
[im 28/96  soft-tissue]
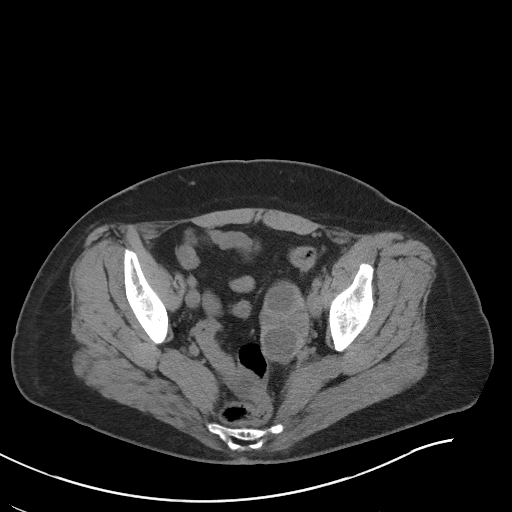
[im 34/96  soft-tissue]
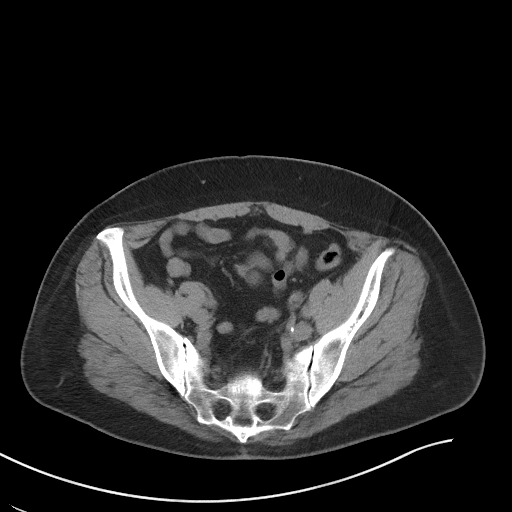
[im 40/96  soft-tissue]
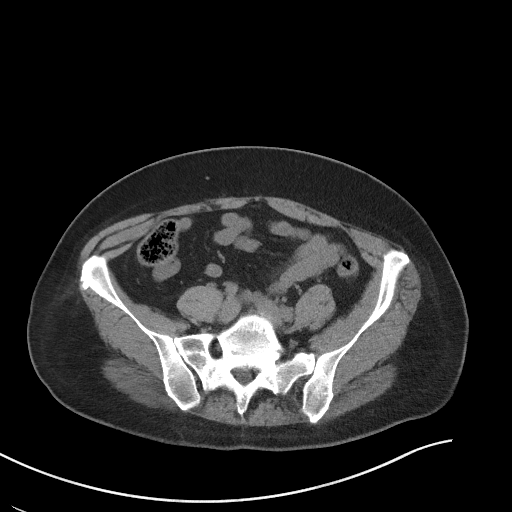
[im 51/96  soft-tissue]
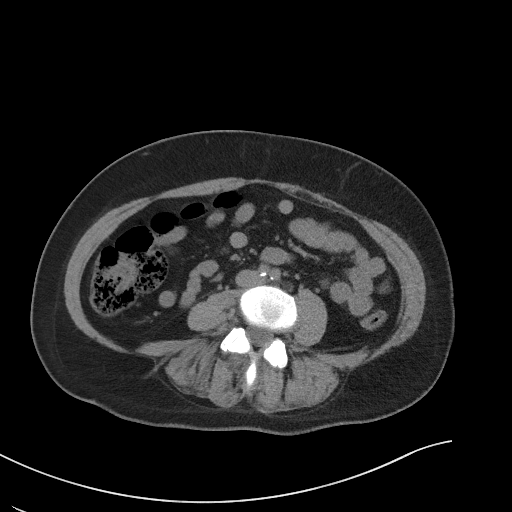
[im 56/96  soft-tissue]
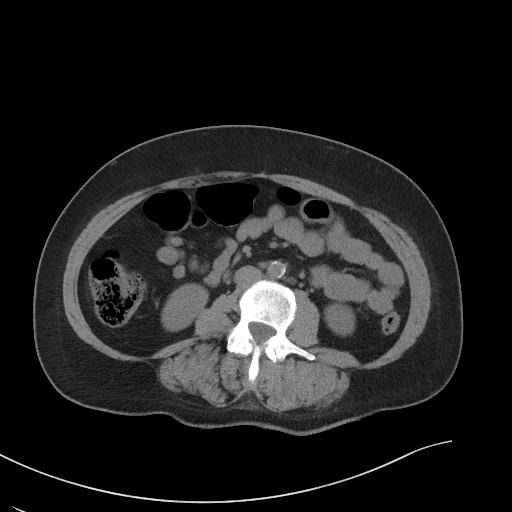
[im 62/96  soft-tissue]
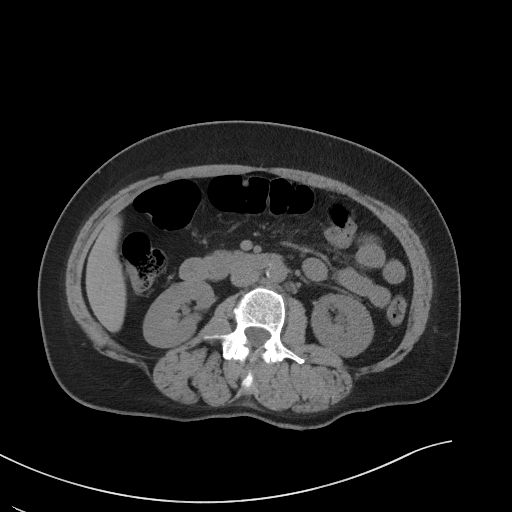
[im 62/96  bone]
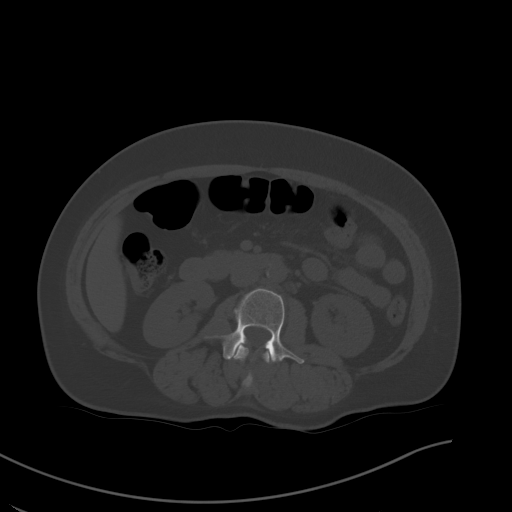
[im 68/96  soft-tissue]
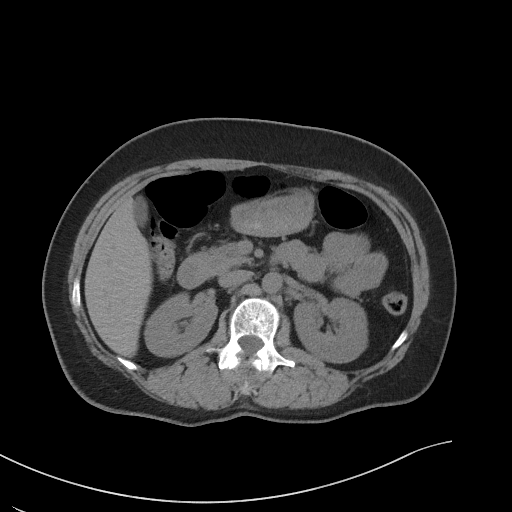
[im 73/96  soft-tissue]
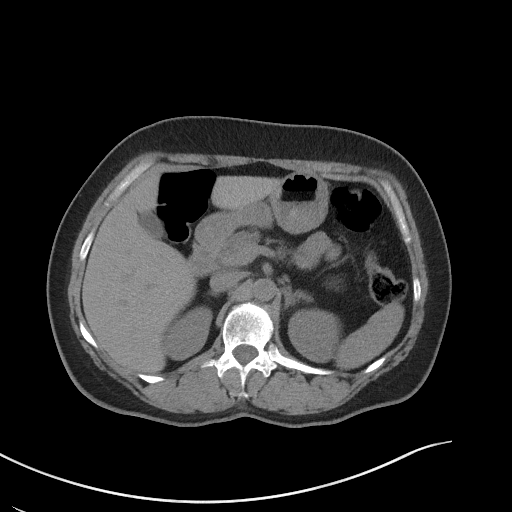
[im 84/96  soft-tissue]
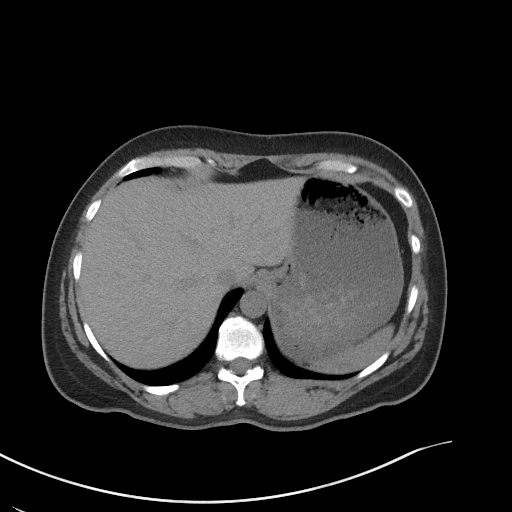
[im 90/96  soft-tissue]
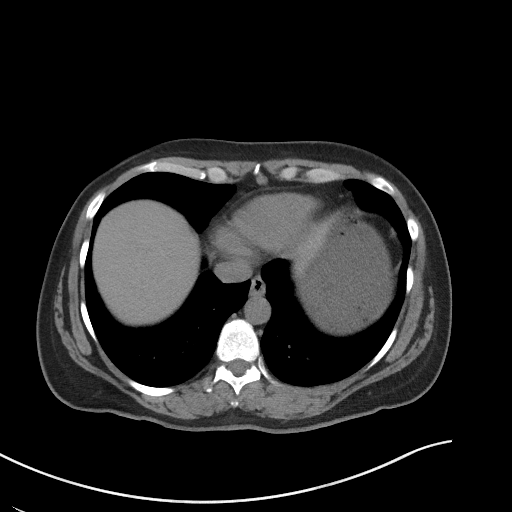

[Series 5: coronal st · coronal · 0.80mm/px · 3 of 103 slices shown]
[im 35/103  soft-tissue]
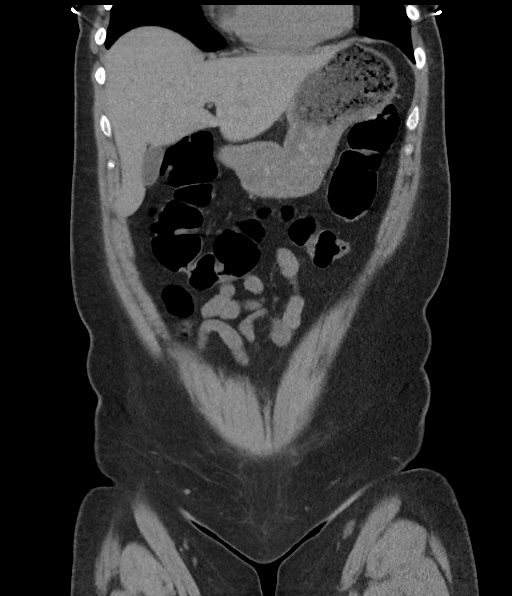
[im 46/103  soft-tissue]
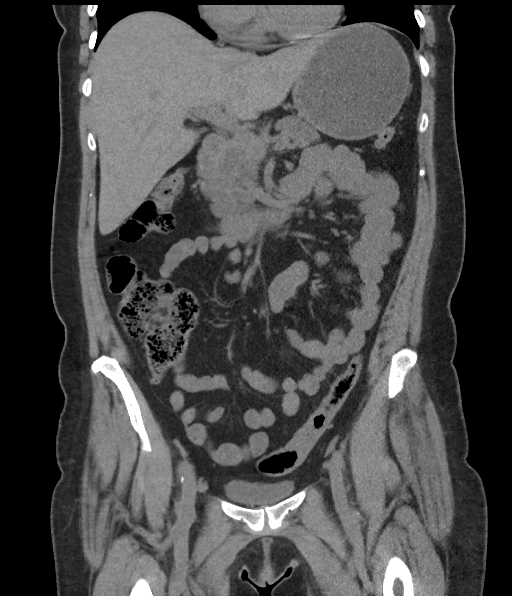
[im 57/103  soft-tissue]
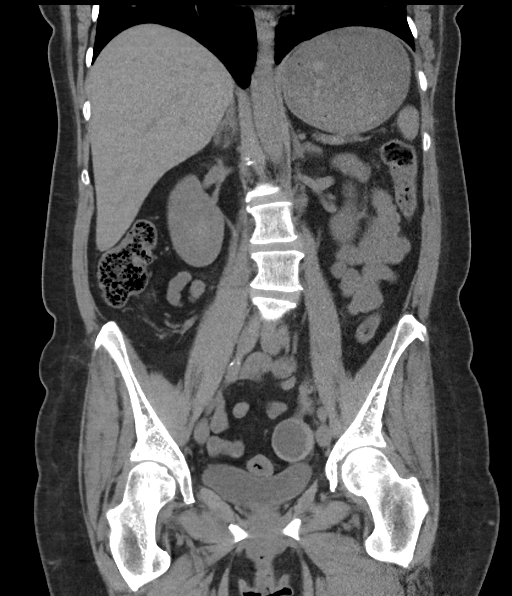

[16 of 46 positions shown; findings below may reference images not displayed]

FINDINGS: Lower chest: Lung bases are clear. Normal heart size. No pericardial
effusion.

Hepatobiliary: No worrisome focal liver abnormality is seen. Normal
gallbladder. No visible calcified gallstones. No biliary ductal
dilatation.

Pancreas: No discernible pancreatic mass or peripancreatic
inflammation. Slight truncation of the pancreatic tail. No
pancreatic ductal dilatation.

Spleen: Normal in size. No concerning splenic lesions.

Adrenals/Urinary Tract: Normal adrenal glands. Kidneys symmetric in
size and normally located. No visible or contour deforming renal
lesions. No urolithiasis or hydronephrosis. Urinary bladder is
unremarkable.

Stomach/Bowel: Stomach is within normal limits. Appendix appears
normal. No evidence of bowel wall thickening, distention, or
inflammatory changes.

Vascular/Lymphatic: Atherosclerotic calcifications within the
abdominal aorta and branch vessels. No aneurysm or ectasia. No
enlarged abdominopelvic lymph nodes.

Reproductive: Fluid attenuation cysts are present in the
asymmetrically enlarged left ovary measuring up to 2.8 and 2.9 cm in
size. No concerning or conspicuous right adnexal lesions. Prior
hysterectomy.

Other: Small volume of simple attenuation free fluid in the deep
pelvis and adnexa. Low-attenuation circumscribed fat attenuation
structure is seen along the left pelvic sidewall (2/73),
nonspecific, though possibly fluid within the planes of the low
pelvis or small focus of fat necrosis.

Musculoskeletal: Multilevel degenerative changes are present in the
imaged portions of the spine. Rudimentary ribs T12. Levocurvature
lumbar spine L3. Transitional L5 vertebrae.
IMPRESSION: No urolithiasis or hydronephrosis.

Asymmetrically enlarged left ovary with 2 small cysts measuring up
to 2.9 cm in size. Given that this finding is ipsilateral to the
patient's pain, could consider further evaluation with pelvic
ultrasound and Doppler to assess for acute ovarian pathology or
possible torsion.

Small volume of low-attenuation fluid in the deep pelvis may be
reactive or physiologic.

Circumscribed focus of fat along the left pelvic sidewall (2/73)
could reflect some layering/insinuating fluid versus fat necrosis or
some reactive process from the adjacent ovary.

## 2023-03-30 ENCOUNTER — Other Ambulatory Visit (HOSPITAL_COMMUNITY): Payer: Self-pay | Admitting: Adult Health Nurse Practitioner

## 2023-03-30 DIAGNOSIS — Z1231 Encounter for screening mammogram for malignant neoplasm of breast: Secondary | ICD-10-CM

## 2023-05-05 ENCOUNTER — Ambulatory Visit (HOSPITAL_COMMUNITY)
Admission: RE | Admit: 2023-05-05 | Discharge: 2023-05-05 | Disposition: A | Discharge status: Home / Self Care | Payer: Commercial Managed Care - PPO | Source: Ambulatory Visit | Attending: Adult Health Nurse Practitioner | Admitting: Adult Health Nurse Practitioner

## 2023-05-05 DIAGNOSIS — Z1231 Encounter for screening mammogram for malignant neoplasm of breast: Secondary | ICD-10-CM | POA: Diagnosis present

## 2023-08-10 ENCOUNTER — Other Ambulatory Visit: Payer: Self-pay | Admitting: Adult Health Nurse Practitioner

## 2023-08-10 DIAGNOSIS — G8929 Other chronic pain: Secondary | ICD-10-CM

## 2023-08-21 ENCOUNTER — Ambulatory Visit
Admission: RE | Admit: 2023-08-21 | Discharge: 2023-08-21 | Disposition: A | Discharge status: Home / Self Care | Payer: Commercial Managed Care - PPO | Source: Ambulatory Visit | Attending: Adult Health Nurse Practitioner | Admitting: Adult Health Nurse Practitioner

## 2023-08-21 DIAGNOSIS — G8929 Other chronic pain: Secondary | ICD-10-CM

## 2023-09-05 ENCOUNTER — Encounter: Payer: Self-pay | Admitting: Gastroenterology

## 2024-04-16 ENCOUNTER — Other Ambulatory Visit: Payer: Self-pay | Admitting: Student

## 2024-04-16 DIAGNOSIS — M5416 Radiculopathy, lumbar region: Secondary | ICD-10-CM

## 2024-04-27 NOTE — Discharge Instructions (Signed)

## 2024-04-30 ENCOUNTER — Ambulatory Visit
Admission: RE | Admit: 2024-04-30 | Discharge: 2024-04-30 | Disposition: A | Discharge status: Home / Self Care | Payer: Self-pay | Source: Ambulatory Visit | Attending: Student | Admitting: Student

## 2024-04-30 ENCOUNTER — Other Ambulatory Visit

## 2024-04-30 ENCOUNTER — Inpatient Hospital Stay
Admission: RE | Admit: 2024-04-30 | Discharge: 2024-04-30 | Disposition: A | Discharge status: Home / Self Care | Source: Ambulatory Visit | Attending: Student | Admitting: Student

## 2024-04-30 DIAGNOSIS — M5416 Radiculopathy, lumbar region: Secondary | ICD-10-CM

## 2024-04-30 MED ORDER — IOPAMIDOL (ISOVUE-M 200) INJECTION 41%
18.0000 mL | Freq: Once | INTRAMUSCULAR | Status: AC
  Administered 2024-04-30: 18 mL via INTRATHECAL

## 2024-04-30 MED ORDER — DIAZEPAM 5 MG PO TABS: 10.0000 mg | ORAL_TABLET | Freq: Once | ORAL | Status: DC

## 2024-04-30 MED ORDER — ONDANSETRON HCL 4 MG/2ML IJ SOLN: 4.0000 mg | Freq: Once | INTRAMUSCULAR | Status: DC | PRN

## 2024-04-30 MED ORDER — MEPERIDINE HCL 50 MG/ML IJ SOLN: 50.0000 mg | Freq: Once | INTRAMUSCULAR | Status: DC | PRN

## 2024-04-30 NOTE — Discharge Instructions (Signed)

## 2024-11-07 ENCOUNTER — Other Ambulatory Visit: Payer: Self-pay | Admitting: Adult Health Nurse Practitioner

## 2024-11-07 DIAGNOSIS — F17219 Nicotine dependence, cigarettes, with unspecified nicotine-induced disorders: Secondary | ICD-10-CM

## 2024-11-15 ENCOUNTER — Inpatient Hospital Stay: Admission: RE | Admit: 2024-11-15 | Discharge: 2024-11-15 | Attending: Adult Health Nurse Practitioner

## 2024-11-15 ENCOUNTER — Ambulatory Visit
Admission: RE | Admit: 2024-11-15 | Discharge: 2024-11-15 | Disposition: A | Source: Ambulatory Visit | Attending: Adult Health Nurse Practitioner

## 2024-11-15 ENCOUNTER — Other Ambulatory Visit

## 2024-11-15 DIAGNOSIS — F17219 Nicotine dependence, cigarettes, with unspecified nicotine-induced disorders: Secondary | ICD-10-CM

## 2025-01-04 ENCOUNTER — Ambulatory Visit: Admitting: Cardiology
